# Patient Record
Sex: Female | Born: 1960 | Race: Black or African American | Hispanic: No | Smoking: Never smoker
Health system: Southern US, Community
[De-identification: ages and names within clinical notes are randomized; demographics above are authoritative.]

## PROBLEM LIST (undated history)

## (undated) DIAGNOSIS — I10 Essential (primary) hypertension: Secondary | ICD-10-CM

## (undated) DIAGNOSIS — M8580 Other specified disorders of bone density and structure, unspecified site: Secondary | ICD-10-CM

## (undated) HISTORY — DX: Other specified disorders of bone density and structure, unspecified site: M85.80

## (undated) HISTORY — PX: PLACEMENT OF BREAST IMPLANTS: SHX6334

## (undated) HISTORY — PX: AUGMENTATION MAMMAPLASTY: SUR837

## (undated) HISTORY — PX: APPENDECTOMY: SHX54

## (undated) HISTORY — DX: Essential (primary) hypertension: I10

## (undated) HISTORY — PX: BREAST IMPLANT REMOVAL: SUR1101

---

## 2001-02-03 ENCOUNTER — Other Ambulatory Visit: Admission: RE | Admit: 2001-02-03 | Discharge: 2001-02-03 | Payer: Self-pay | Admitting: Obstetrics & Gynecology

## 2002-03-04 ENCOUNTER — Other Ambulatory Visit: Admission: RE | Admit: 2002-03-04 | Discharge: 2002-03-04 | Payer: Self-pay | Admitting: Obstetrics & Gynecology

## 2003-03-14 ENCOUNTER — Other Ambulatory Visit: Admission: RE | Admit: 2003-03-14 | Discharge: 2003-03-14 | Payer: Self-pay | Admitting: Obstetrics & Gynecology

## 2004-03-18 ENCOUNTER — Other Ambulatory Visit: Admission: RE | Admit: 2004-03-18 | Discharge: 2004-03-18 | Payer: Self-pay | Admitting: Obstetrics & Gynecology

## 2005-03-19 ENCOUNTER — Other Ambulatory Visit: Admission: RE | Admit: 2005-03-19 | Discharge: 2005-03-19 | Payer: Self-pay | Admitting: Obstetrics & Gynecology

## 2007-04-27 ENCOUNTER — Emergency Department (HOSPITAL_COMMUNITY): Admission: EM | Admit: 2007-04-27 | Discharge: 2007-04-27 | Payer: Self-pay | Admitting: Emergency Medicine

## 2008-09-22 ENCOUNTER — Emergency Department (HOSPITAL_BASED_OUTPATIENT_CLINIC_OR_DEPARTMENT_OTHER): Admission: EM | Admit: 2008-09-22 | Discharge: 2008-09-22 | Payer: Self-pay | Admitting: Emergency Medicine

## 2010-10-02 LAB — URINE MICROSCOPIC-ADD ON

## 2010-10-02 LAB — URINALYSIS, ROUTINE W REFLEX MICROSCOPIC
Ketones, ur: NEGATIVE mg/dL
Leukocytes, UA: NEGATIVE
Nitrite: NEGATIVE
Protein, ur: NEGATIVE mg/dL
Urobilinogen, UA: 0.2 mg/dL (ref 0.0–1.0)

## 2011-04-01 LAB — I-STAT 8, (EC8 V) (CONVERTED LAB)
BUN: 13
Bicarbonate: 24.2 — ABNORMAL HIGH
Chloride: 105
Sodium: 139

## 2011-04-01 LAB — POCT CARDIAC MARKERS: CKMB, poc: <1 — ABNORMAL LOW

## 2012-02-04 ENCOUNTER — Other Ambulatory Visit: Payer: Self-pay | Admitting: Family Medicine

## 2012-02-04 DIAGNOSIS — R319 Hematuria, unspecified: Secondary | ICD-10-CM

## 2012-02-04 DIAGNOSIS — R351 Nocturia: Secondary | ICD-10-CM

## 2012-02-05 ENCOUNTER — Ambulatory Visit
Admission: RE | Admit: 2012-02-05 | Discharge: 2012-02-05 | Disposition: A | Discharge status: Home / Self Care | Payer: Managed Care, Other (non HMO) | Source: Ambulatory Visit | Attending: Family Medicine | Admitting: Family Medicine

## 2012-02-05 DIAGNOSIS — R319 Hematuria, unspecified: Secondary | ICD-10-CM

## 2012-02-05 DIAGNOSIS — R351 Nocturia: Secondary | ICD-10-CM

## 2014-08-08 ENCOUNTER — Other Ambulatory Visit: Payer: Self-pay | Admitting: Family Medicine

## 2014-08-08 DIAGNOSIS — R1030 Lower abdominal pain, unspecified: Secondary | ICD-10-CM

## 2014-08-09 ENCOUNTER — Ambulatory Visit
Admission: RE | Admit: 2014-08-09 | Discharge: 2014-08-09 | Disposition: A | Discharge status: Home / Self Care | Payer: BLUE CROSS/BLUE SHIELD | Source: Ambulatory Visit | Attending: Family Medicine | Admitting: Family Medicine

## 2014-08-09 ENCOUNTER — Other Ambulatory Visit: Payer: Self-pay | Admitting: Family Medicine

## 2014-08-09 ENCOUNTER — Encounter (INDEPENDENT_AMBULATORY_CARE_PROVIDER_SITE_OTHER): Payer: Self-pay

## 2014-08-09 DIAGNOSIS — R1030 Lower abdominal pain, unspecified: Secondary | ICD-10-CM

## 2014-08-10 ENCOUNTER — Ambulatory Visit
Admission: RE | Admit: 2014-08-10 | Discharge: 2014-08-10 | Disposition: A | Discharge status: Home / Self Care | Payer: BLUE CROSS/BLUE SHIELD | Source: Ambulatory Visit | Attending: Family Medicine | Admitting: Family Medicine

## 2014-08-10 DIAGNOSIS — R1030 Lower abdominal pain, unspecified: Secondary | ICD-10-CM

## 2014-08-11 ENCOUNTER — Other Ambulatory Visit: Payer: Managed Care, Other (non HMO)

## 2014-08-14 ENCOUNTER — Other Ambulatory Visit: Payer: Managed Care, Other (non HMO)

## 2015-08-28 ENCOUNTER — Other Ambulatory Visit: Payer: Self-pay | Admitting: Family Medicine

## 2015-11-22 DIAGNOSIS — M2012 Hallux valgus (acquired), left foot: Secondary | ICD-10-CM | POA: Diagnosis not present

## 2015-11-22 DIAGNOSIS — N3001 Acute cystitis with hematuria: Secondary | ICD-10-CM | POA: Diagnosis not present

## 2015-11-22 DIAGNOSIS — R350 Frequency of micturition: Secondary | ICD-10-CM | POA: Diagnosis not present

## 2015-12-18 DIAGNOSIS — H04123 Dry eye syndrome of bilateral lacrimal glands: Secondary | ICD-10-CM | POA: Diagnosis not present

## 2015-12-28 DIAGNOSIS — R350 Frequency of micturition: Secondary | ICD-10-CM | POA: Diagnosis not present

## 2015-12-28 DIAGNOSIS — R351 Nocturia: Secondary | ICD-10-CM | POA: Diagnosis not present

## 2016-01-23 DIAGNOSIS — Z1231 Encounter for screening mammogram for malignant neoplasm of breast: Secondary | ICD-10-CM | POA: Diagnosis not present

## 2016-03-18 DIAGNOSIS — H04123 Dry eye syndrome of bilateral lacrimal glands: Secondary | ICD-10-CM | POA: Diagnosis not present

## 2016-05-19 DIAGNOSIS — R351 Nocturia: Secondary | ICD-10-CM | POA: Diagnosis not present

## 2016-05-19 DIAGNOSIS — R350 Frequency of micturition: Secondary | ICD-10-CM | POA: Diagnosis not present

## 2016-05-30 DIAGNOSIS — L708 Other acne: Secondary | ICD-10-CM | POA: Diagnosis not present

## 2016-06-20 DIAGNOSIS — Z0000 Encounter for general adult medical examination without abnormal findings: Secondary | ICD-10-CM | POA: Diagnosis not present

## 2016-07-18 DIAGNOSIS — H04123 Dry eye syndrome of bilateral lacrimal glands: Secondary | ICD-10-CM | POA: Diagnosis not present

## 2016-08-07 DIAGNOSIS — F4323 Adjustment disorder with mixed anxiety and depressed mood: Secondary | ICD-10-CM | POA: Diagnosis not present

## 2016-09-04 DIAGNOSIS — Z131 Encounter for screening for diabetes mellitus: Secondary | ICD-10-CM | POA: Diagnosis not present

## 2016-09-04 DIAGNOSIS — Z0000 Encounter for general adult medical examination without abnormal findings: Secondary | ICD-10-CM | POA: Diagnosis not present

## 2016-09-04 DIAGNOSIS — Z1159 Encounter for screening for other viral diseases: Secondary | ICD-10-CM | POA: Diagnosis not present

## 2016-10-27 DIAGNOSIS — F5101 Primary insomnia: Secondary | ICD-10-CM | POA: Diagnosis not present

## 2016-10-27 DIAGNOSIS — R51 Headache: Secondary | ICD-10-CM | POA: Diagnosis not present

## 2016-10-27 DIAGNOSIS — R42 Dizziness and giddiness: Secondary | ICD-10-CM | POA: Diagnosis not present

## 2016-11-27 ENCOUNTER — Other Ambulatory Visit: Payer: Self-pay | Admitting: Family Medicine

## 2016-11-27 DIAGNOSIS — G4452 New daily persistent headache (NDPH): Secondary | ICD-10-CM

## 2016-12-14 ENCOUNTER — Encounter: Payer: Self-pay | Admitting: Radiology

## 2016-12-14 ENCOUNTER — Ambulatory Visit
Admission: RE | Admit: 2016-12-14 | Discharge: 2016-12-14 | Disposition: A | Discharge status: Home / Self Care | Payer: BLUE CROSS/BLUE SHIELD | Source: Ambulatory Visit | Attending: Family Medicine | Admitting: Family Medicine

## 2016-12-14 DIAGNOSIS — G4452 New daily persistent headache (NDPH): Secondary | ICD-10-CM

## 2016-12-14 DIAGNOSIS — R42 Dizziness and giddiness: Secondary | ICD-10-CM | POA: Diagnosis not present

## 2016-12-14 MED ORDER — GADOBENATE DIMEGLUMINE 529 MG/ML IV SOLN
13.0000 mL | Freq: Once | INTRAVENOUS | Status: AC | PRN
  Administered 2016-12-14: 13 mL via INTRAVENOUS

## 2017-02-16 DIAGNOSIS — Z1231 Encounter for screening mammogram for malignant neoplasm of breast: Secondary | ICD-10-CM | POA: Diagnosis not present

## 2017-03-24 DIAGNOSIS — Z23 Encounter for immunization: Secondary | ICD-10-CM | POA: Diagnosis not present

## 2017-03-24 DIAGNOSIS — F5101 Primary insomnia: Secondary | ICD-10-CM | POA: Diagnosis not present

## 2017-05-08 ENCOUNTER — Ambulatory Visit (INDEPENDENT_AMBULATORY_CARE_PROVIDER_SITE_OTHER): Payer: BLUE CROSS/BLUE SHIELD | Admitting: Obstetrics & Gynecology

## 2017-05-08 ENCOUNTER — Encounter: Payer: Self-pay | Admitting: Obstetrics & Gynecology

## 2017-05-08 VITALS — BP 132/84 | Ht 60.0 in | Wt 147.0 lb

## 2017-05-08 DIAGNOSIS — Z13820 Encounter for screening for osteoporosis: Secondary | ICD-10-CM

## 2017-05-08 DIAGNOSIS — Z01419 Encounter for gynecological examination (general) (routine) without abnormal findings: Secondary | ICD-10-CM

## 2017-05-08 DIAGNOSIS — Z113 Encounter for screening for infections with a predominantly sexual mode of transmission: Secondary | ICD-10-CM

## 2017-05-08 DIAGNOSIS — Z780 Asymptomatic menopausal state: Secondary | ICD-10-CM | POA: Diagnosis not present

## 2017-05-08 DIAGNOSIS — R351 Nocturia: Secondary | ICD-10-CM | POA: Diagnosis not present

## 2017-05-08 MED ORDER — SULFAMETHOXAZOLE-TRIMETHOPRIM 800-160 MG PO TABS: 1.0000 | ORAL_TABLET | Freq: Two times a day (BID) | ORAL | 0 refills | Status: AC

## 2017-05-08 NOTE — Progress Notes (Signed)
Lori MatesDora J Snyder 08-Jun-1961 [id number]   History:    56 y.o. G4P2A2 Widowed x 2010.  BT/S.  Broke up with boyfriend x about 7 months.  Abstinent currently.  Gym trainer.  RP:  Established patient presenting  for annual gyn exam   HPI:  Menopause.  No HRT.  No PMB.  Hot flushes/night sweats improving, tolerable on Estrovent.  No pelvic pain.  Breasts wnl.  Urine normal, except for frequent, but stable nocturia.  Drinks a lot of water, no caffein products.  Last glass of water at 7 pm.  BMs wnl.  Past medical history,surgical history, family history and social history were all reviewed and documented in the EPIC chart.  Gynecologic History No LMP recorded. Patient is postmenopausal. Contraception: post menopausal status Last Pap: 04/2015. Results were: normal Last mammogram: 02/2017. Results were: normal  Obstetric History OB History  No data available     ROS: A ROS was performed and pertinent positives and negatives are included in the history.  GENERAL: No fevers or chills. HEENT: No change in vision, no earache, sore throat or sinus congestion. NECK: No pain or stiffness. CARDIOVASCULAR: No chest pain or pressure. No palpitations. PULMONARY: No shortness of breath, cough or wheeze. GASTROINTESTINAL: No abdominal pain, nausea, vomiting or diarrhea, melena or bright red blood per rectum. GENITOURINARY: No urinary frequency, urgency, hesitancy or dysuria. MUSCULOSKELETAL: No joint or muscle pain, no back pain, no recent trauma. DERMATOLOGIC: No rash, no itching, no lesions. ENDOCRINE: No polyuria, polydipsia, no heat or cold intolerance. No recent change in weight. HEMATOLOGICAL: No anemia or easy bruising or bleeding. NEUROLOGIC: No headache, seizures, numbness, tingling or weakness. PSYCHIATRIC: No depression, no loss of interest in normal activity or change in sleep pattern.     Exam:   BP 132/84   Ht 5' (1.524 m)   Wt 147 lb (66.7 kg)   BMI 28.71 kg/m   Body mass index is 28.71  kg/m.  General appearance : Well developed well nourished female. No acute distress HEENT: Eyes: no retinal hemorrhage or exudates,  Neck supple, trachea midline, no carotid bruits, no thyroidmegaly Lungs: Clear to auscultation, no rhonchi or wheezes, or rib retractions  Heart: Regular rate and rhythm, no murmurs or gallops Breast:Examined in sitting and supine position were symmetrical in appearance, no palpable masses or tenderness,  no skin retraction, no nipple inversion, no nipple discharge, no skin discoloration, no axillary or supraclavicular lymphadenopathy Abdomen: no palpable masses or tenderness, no rebound or guarding Extremities: no edema or skin discoloration or tenderness  Pelvic: Vulva normal  Bartholin, Urethra, Skene Glands: Within normal limits             Vagina: No gross lesions or discharge  Cervix: No gross lesions or discharge.  Pap/HPV HR, Gono-Chlam done.  Uterus  AV, normal size, shape and consistency, non-tender and mobile  Adnexa  Without masses or tenderness  Anus and perineum  normal  U/A  Cloudy. 20-40 WBC, 0-2 RBC, moderate Bacteria.  Nit neg.  Pending U. Culture.  Assessment/Plan:  56 y.o. female for annual exam   1. Encounter for routine gynecological examination with Papanicolaou smear of cervix Normal gynecologic exam.  Pap test with HPV high risk done.  Breast exam normal.  Recent screening mammogram normal.  Colonoscopy done in 2015.  Fasting labs with family physician.  2. Menopause present Menopause well-tolerated without hormone replacement therapy.  No postmenopausal bleeding.  3. Screen for STD (sexually transmitted disease) Recommend condoms - Gono-Chlam  on Pap - HIV antibody (with reflex) - RPR - Hepatitis B Surface AntiGEN - Hepatitis C Antibody  4. Screening for osteoporosis Vitamin D supplements, calcium rich nutrition and weightbearing physical activity to continue.  Follow-up with bone density. - DG Bone Density; Future  5.  Nocturia Urinalysis compatible with cystitis.  Pending urine culture.  Decision to treat with Bactrim.  Usage discussed. - Urinalysis with Culture Reflex  Counseling on above issues >50% x 10 minutes  Genia DelMarie-Lyne Antar Milks MD, 3:11 PM 05/08/2017

## 2017-05-09 ENCOUNTER — Encounter: Payer: Self-pay | Admitting: Obstetrics & Gynecology

## 2017-05-09 NOTE — Patient Instructions (Signed)
1. Encounter for routine gynecological examination with Papanicolaou smear of cervix Normal gynecologic exam.  Pap test with HPV high risk done.  Breast exam normal.  Recent screening mammogram normal.  Colonoscopy done in 2015.  Fasting labs with family physician.  2. Menopause present Menopause well-tolerated without hormone replacement therapy.  No postmenopausal bleeding.  3. Screen for STD (sexually transmitted disease) Recommend condoms - Gono-Chlam on Pap - HIV antibody (with reflex) - RPR - Hepatitis B Surface AntiGEN - Hepatitis C Antibody  4. Screening for osteoporosis Vitamin D supplements, calcium rich nutrition and weightbearing physical activity to continue.  Follow-up with bone density. - DG Bone Density; Future  5. Nocturia Urinalysis compatible with cystitis.  Pending urine culture.  Decision to treat with Bactrim.  Usage discussed. - Urinalysis with Culture Reflex  Lori Snyder, it was a pleasure seeing you today!  I will inform you of all your results as soon as available.   Urinary Tract Infection, Adult A urinary tract infection (UTI) is an infection of any part of the urinary tract, which includes the kidneys, ureters, bladder, and urethra. These organs make, store, and get rid of urine in the body. UTI can be a bladder infection (cystitis) or kidney infection (pyelonephritis). What are the causes? This infection may be caused by fungi, viruses, or bacteria. Bacteria are the most common cause of UTIs. This condition can also be caused by repeated incomplete emptying of the bladder during urination. What increases the risk? This condition is more likely to develop if:  You ignore your need to urinate or hold urine for long periods of time.  You do not empty your bladder completely during urination.  You wipe back to front after urinating or having a bowel movement, if you are female.  You are uncircumcised, if you are female.  You are constipated.  You have a  urinary catheter that stays in place (indwelling).  You have a weak defense (immune) system.  You have a medical condition that affects your bowels, kidneys, or bladder.  You have diabetes.  You take antibiotic medicines frequently or for long periods of time, and the antibiotics no longer work well against certain types of infections (antibiotic resistance).  You take medicines that irritate your urinary tract.  You are exposed to chemicals that irritate your urinary tract.  You are female.  What are the signs or symptoms? Symptoms of this condition include:  Fever.  Frequent urination or passing small amounts of urine frequently.  Needing to urinate urgently.  Pain or burning with urination.  Urine that smells bad or unusual.  Cloudy urine.  Pain in the lower abdomen or back.  Trouble urinating.  Blood in the urine.  Vomiting or being less hungry than normal.  Diarrhea or abdominal pain.  Vaginal discharge, if you are female.  How is this diagnosed? This condition is diagnosed with a medical history and physical exam. You will also need to provide a urine sample to test your urine. Other tests may be done, including:  Blood tests.  Sexually transmitted disease (STD) testing.  If you have had more than one UTI, a cystoscopy or imaging studies may be done to determine the cause of the infections. How is this treated? Treatment for this condition often includes a combination of two or more of the following:  Antibiotic medicine.  Other medicines to treat less common causes of UTI.  Over-the-counter medicines to treat pain.  Drinking enough water to stay hydrated.  Follow these instructions  at home:  Take over-the-counter and prescription medicines only as told by your health care provider.  If you were prescribed an antibiotic, take it as told by your health care provider. Do not stop taking the antibiotic even if you start to feel better.  Avoid  alcohol, caffeine, tea, and carbonated beverages. They can irritate your bladder.  Drink enough fluid to keep your urine clear or pale yellow.  Keep all follow-up visits as told by your health care provider. This is important.  Make sure to: ? Empty your bladder often and completely. Do not hold urine for long periods of time. ? Empty your bladder before and after sex. ? Wipe from front to back after a bowel movement if you are female. Use each tissue one time when you wipe. Contact a health care provider if:  You have back pain.  You have a fever.  You feel nauseous or vomit.  Your symptoms do not get better after 3 days.  Your symptoms go away and then return. Get help right away if:  You have severe back pain or lower abdominal pain.  You are vomiting and cannot keep down any medicines or water. This information is not intended to replace advice given to you by your health care provider. Make sure you discuss any questions you have with your health care provider. Document Released: 03/19/2005 Document Revised: 11/21/2015 Document Reviewed: 04/30/2015 Elsevier Interactive Patient Education  2017 [place] Maintenance for Postmenopausal Women Menopause is a normal process in which your reproductive ability comes to an end. This process happens gradually over a span of months to years, usually between the ages of 82 and 84. Menopause is complete when you have missed 12 consecutive menstrual periods. It is important to talk with your health care provider about some of the most common conditions that affect postmenopausal women, such as heart disease, cancer, and bone loss (osteoporosis). Adopting a healthy lifestyle and getting preventive care can help to promote your health and wellness. Those actions can also lower your chances of developing some of these common conditions. What should I know about menopause? During menopause, you may experience a number of symptoms,  such as:  Moderate-to-severe hot flashes.  Night sweats.  Decrease in sex drive.  Mood swings.  Headaches.  Tiredness.  Irritability.  Memory problems.  Insomnia.  Choosing to treat or not to treat menopausal changes is an individual decision that you make with your health care provider. What should I know about hormone replacement therapy and supplements? Hormone therapy products are effective for treating symptoms that are associated with menopause, such as hot flashes and night sweats. Hormone replacement carries certain risks, especially as you become older. If you are thinking about using estrogen or estrogen with progestin treatments, discuss the benefits and risks with your health care provider. What should I know about heart disease and stroke? Heart disease, heart attack, and stroke become more likely as you age. This may be due, in part, to the hormonal changes that your body experiences during menopause. These can affect how your body processes dietary fats, triglycerides, and cholesterol. Heart attack and stroke are both medical emergencies. There are many things that you can do to help prevent heart disease and stroke:  Have your blood pressure checked at least every 1-2 years. High blood pressure causes heart disease and increases the risk of stroke.  If you are 31-1 years old, ask your health care provider if you should take aspirin to  prevent a heart attack or a stroke.  Do not use any tobacco products, including cigarettes, chewing tobacco, or electronic cigarettes. If you need help quitting, ask your health care provider.  It is important to eat a healthy diet and maintain a healthy weight. ? Be sure to include plenty of vegetables, fruits, low-fat dairy products, and lean protein. ? Avoid eating foods that are high in solid fats, added sugars, or salt (sodium).  Get regular exercise. This is one of the most important things that you can do for your  health. ? Try to exercise for at least 150 minutes each week. The type of exercise that you do should increase your heart rate and make you sweat. This is known as moderate-intensity exercise. ? Try to do strengthening exercises at least twice each week. Do these in addition to the moderate-intensity exercise.  Know your numbers.Ask your health care provider to check your cholesterol and your blood glucose. Continue to have your blood tested as directed by your health care provider.  What should I know about cancer screening? There are several types of cancer. Take the following steps to reduce your risk and to catch any cancer development as early as possible. Breast Cancer  Practice breast self-awareness. ? This means understanding how your breasts normally appear and feel. ? It also means doing regular breast self-exams. Let your health care provider know about any changes, no matter how small.  If you are 36 or older, have a clinician do a breast exam (clinical breast exam or CBE) every year. Depending on your age, family history, and medical history, it may be recommended that you also have a yearly breast X-ray (mammogram).  If you have a family history of breast cancer, talk with your health care provider about genetic screening.  If you are at high risk for breast cancer, talk with your health care provider about having an MRI and a mammogram every year.  Breast cancer (BRCA) gene test is recommended for women who have family members with BRCA-related cancers. Results of the assessment will determine the need for genetic counseling and BRCA1 and for BRCA2 testing. BRCA-related cancers include these types: ? Breast. This occurs in males or females. ? Ovarian. ? Tubal. This may also be called fallopian tube cancer. ? Cancer of the abdominal or pelvic lining (peritoneal cancer). ? Prostate. ? Pancreatic.  Cervical, Uterine, and Ovarian Cancer Your health care provider may recommend  that you be screened regularly for cancer of the pelvic organs. These include your ovaries, uterus, and vagina. This screening involves a pelvic exam, which includes checking for microscopic changes to the surface of your cervix (Pap test).  For women ages 21-65, health care providers may recommend a pelvic exam and a Pap test every three years. For women ages 69-65, they may recommend the Pap test and pelvic exam, combined with testing for human papilloma virus (HPV), every five years. Some types of HPV increase your risk of cervical cancer. Testing for HPV may also be done on women of any age who have unclear Pap test results.  Other health care providers may not recommend any screening for nonpregnant women who are considered low risk for pelvic cancer and have no symptoms. Ask your health care provider if a screening pelvic exam is right for you.  If you have had past treatment for cervical cancer or a condition that could lead to cancer, you need Pap tests and screening for cancer for at least 20 years after  your treatment. If Pap tests have been discontinued for you, your risk factors (such as having a new sexual partner) need to be reassessed to determine if you should start having screenings again. Some women have medical problems that increase the chance of getting cervical cancer. In these cases, your health care provider may recommend that you have screening and Pap tests more often.  If you have a family history of uterine cancer or ovarian cancer, talk with your health care provider about genetic screening.  If you have vaginal bleeding after reaching menopause, tell your health care provider.  There are currently no reliable tests available to screen for ovarian cancer.  Lung Cancer Lung cancer screening is recommended for adults 44-45 years old who are at high risk for lung cancer because of a history of smoking. A yearly low-dose CT scan of the lungs is recommended if you:  Currently  smoke.  Have a history of at least 30 pack-years of smoking and you currently smoke or have quit within the past 15 years. A pack-year is smoking an average of one pack of cigarettes per day for one year.  Yearly screening should:  Continue until it has been 15 years since you quit.  Stop if you develop a health problem that would prevent you from having lung cancer treatment.  Colorectal Cancer  This type of cancer can be detected and can often be prevented.  Routine colorectal cancer screening usually begins at age 37 and continues through age 67.  If you have risk factors for colon cancer, your health care provider may recommend that you be screened at an earlier age.  If you have a family history of colorectal cancer, talk with your health care provider about genetic screening.  Your health care provider may also recommend using home test kits to check for hidden blood in your stool.  A small camera at the end of a tube can be used to examine your colon directly (sigmoidoscopy or colonoscopy). This is done to check for the earliest forms of colorectal cancer.  Direct examination of the colon should be repeated every 5-10 years until age 15. However, if early forms of precancerous polyps or small growths are found or if you have a family history or genetic risk for colorectal cancer, you may need to be screened more often.  Skin Cancer  Check your skin from head to toe regularly.  Monitor any moles. Be sure to tell your health care provider: ? About any new moles or changes in moles, especially if there is a change in a mole's shape or color. ? If you have a mole that is larger than the size of a pencil eraser.  If any of your family members has a history of skin cancer, especially at a young age, talk with your health care provider about genetic screening.  Always use sunscreen. Apply sunscreen liberally and repeatedly throughout the day.  Whenever you are outside, protect  yourself by wearing long sleeves, pants, a wide-brimmed hat, and sunglasses.  What should I know about osteoporosis? Osteoporosis is a condition in which bone destruction happens more quickly than new bone creation. After menopause, you may be at an increased risk for osteoporosis. To help prevent osteoporosis or the bone fractures that can happen because of osteoporosis, the following is recommended:  If you are 22-61 years old, get at least 1,000 mg of calcium and at least 600 mg of vitamin D per day.  If you are older than  age 41 but younger than age 24, get at least 1,200 mg of calcium and at least 600 mg of vitamin D per day.  If you are older than age 50, get at least 1,200 mg of calcium and at least 800 mg of vitamin D per day.  Smoking and excessive alcohol intake increase the risk of osteoporosis. Eat foods that are rich in calcium and vitamin D, and do weight-bearing exercises several times each week as directed by your health care provider. What should I know about how menopause affects my mental health? Depression may occur at any age, but it is more common as you become older. Common symptoms of depression include:  Low or sad mood.  Changes in sleep patterns.  Changes in appetite or eating patterns.  Feeling an overall lack of motivation or enjoyment of activities that you previously enjoyed.  Frequent crying spells.  Talk with your health care provider if you think that you are experiencing depression. What should I know about immunizations? It is important that you get and maintain your immunizations. These include:  Tetanus, diphtheria, and pertussis (Tdap) booster vaccine.  Influenza every year before the flu season begins.  Pneumonia vaccine.  Shingles vaccine.  Your health care provider may also recommend other immunizations. This information is not intended to replace advice given to you by your health care provider. Make sure you discuss any questions you  have with your health care provider. Document Released: 08/01/2005 Document Revised: 12/28/2015 Document Reviewed: 03/13/2015 Elsevier Interactive Patient Education  2018 [employer].

## 2017-05-10 LAB — URINALYSIS W MICROSCOPIC + REFLEX CULTURE
Bilirubin Urine: NEGATIVE
GLUCOSE, UA: NEGATIVE
Granular Cast: NONE SEEN /LPF
Hyaline Cast: NONE SEEN /LPF
Ketones, ur: NEGATIVE
NITRITES URINE, INITIAL: NEGATIVE
PH: 5 (ref 5.0–8.0)
Protein, ur: NEGATIVE
SPECIFIC GRAVITY, URINE: 1.02 (ref 1.001–1.03)

## 2017-05-10 LAB — URINE CULTURE
MICRO NUMBER:: 81296827
SPECIMEN QUALITY: ADEQUATE

## 2017-05-10 LAB — CULTURE INDICATED

## 2017-05-11 LAB — HEPATITIS B SURFACE ANTIGEN: HEP B S AG: NONREACTIVE

## 2017-05-11 LAB — HEPATITIS C ANTIBODY
Hepatitis C Ab: NONREACTIVE
SIGNAL TO CUT-OFF: 0.02 (ref <1.00)

## 2017-05-11 LAB — HIV ANTIBODY (ROUTINE TESTING W REFLEX): HIV: NONREACTIVE

## 2017-05-11 LAB — RPR: RPR: NONREACTIVE

## 2017-05-13 ENCOUNTER — Other Ambulatory Visit: Payer: Self-pay | Admitting: Gynecology

## 2017-05-13 ENCOUNTER — Telehealth: Payer: Self-pay | Admitting: *Deleted

## 2017-05-13 DIAGNOSIS — Z13820 Encounter for screening for osteoporosis: Secondary | ICD-10-CM

## 2017-05-13 NOTE — Telephone Encounter (Signed)
Pt informed

## 2017-05-13 NOTE — Telephone Encounter (Signed)
Patient completed setpra ds x 3 days, states still having frequent urination several times. Please advise

## 2017-05-13 NOTE — Telephone Encounter (Signed)
Urine culture was negative.  Recommend increase water intake, avoid caffeine products and observation.

## 2017-05-22 LAB — PAP IG, CT-NG NAA, HPV HIGH-RISK
C. trachomatis RNA, TMA: NOT DETECTED
HPV DNA High Risk: DETECTED — AB
N. GONORRHOEAE RNA, TMA: NOT DETECTED

## 2017-05-22 LAB — HPV TYPE 16 AND 18/45 RNA
HPV Type 16 RNA: NOT DETECTED
HPV Type 18/45 RNA: NOT DETECTED

## 2017-05-23 DIAGNOSIS — M8580 Other specified disorders of bone density and structure, unspecified site: Secondary | ICD-10-CM

## 2017-05-23 HISTORY — DX: Other specified disorders of bone density and structure, unspecified site: M85.80

## 2017-05-29 DIAGNOSIS — H04123 Dry eye syndrome of bilateral lacrimal glands: Secondary | ICD-10-CM | POA: Diagnosis not present

## 2017-06-04 ENCOUNTER — Encounter: Payer: Self-pay | Admitting: Gynecology

## 2017-06-04 ENCOUNTER — Ambulatory Visit (INDEPENDENT_AMBULATORY_CARE_PROVIDER_SITE_OTHER): Payer: BLUE CROSS/BLUE SHIELD

## 2017-06-04 ENCOUNTER — Other Ambulatory Visit: Payer: Self-pay | Admitting: Gynecology

## 2017-06-04 DIAGNOSIS — Z13820 Encounter for screening for osteoporosis: Secondary | ICD-10-CM

## 2017-06-04 DIAGNOSIS — M8588 Other specified disorders of bone density and structure, other site: Secondary | ICD-10-CM | POA: Diagnosis not present

## 2017-06-26 DIAGNOSIS — S40029A Contusion of unspecified upper arm, initial encounter: Secondary | ICD-10-CM | POA: Diagnosis not present

## 2017-07-10 ENCOUNTER — Encounter: Payer: Self-pay | Admitting: Obstetrics & Gynecology

## 2017-07-10 ENCOUNTER — Ambulatory Visit (INDEPENDENT_AMBULATORY_CARE_PROVIDER_SITE_OTHER): Payer: BLUE CROSS/BLUE SHIELD | Admitting: Obstetrics & Gynecology

## 2017-07-10 VITALS — BP 128/86

## 2017-07-10 DIAGNOSIS — B977 Papillomavirus as the cause of diseases classified elsewhere: Secondary | ICD-10-CM

## 2017-07-10 DIAGNOSIS — N72 Inflammatory disease of cervix uteri: Secondary | ICD-10-CM | POA: Diagnosis not present

## 2017-07-10 DIAGNOSIS — N870 Mild cervical dysplasia: Secondary | ICD-10-CM | POA: Diagnosis not present

## 2017-07-10 DIAGNOSIS — Z13220 Encounter for screening for lipoid disorders: Secondary | ICD-10-CM | POA: Diagnosis not present

## 2017-07-10 NOTE — Addendum Note (Signed)
Addended by: Berna SpareASTILLO, BLANCA A on: 07/10/2017 12:10 PM   Modules accepted: Orders

## 2017-07-10 NOTE — Patient Instructions (Signed)
1. High risk human papilloma virus (HPV) infection of cervix Colposcopy done today.  Probably normal, possible mild dysplasia.  Cervical biopsy pending.  HPV 16, 18 and 45 negative.  Management per cervical biopsy results.   Colposcopy, Care After This sheet gives you information about how to care for yourself after your procedure. Your health care provider may also give you more specific instructions. If you have problems or questions, contact your health care provider. What can I expect after the procedure? If you had a colposcopy without a biopsy, you can expect to feel fine right away, but you may have some spotting for a few days. You can go back to your normal activities. If you had a colposcopy with a biopsy, it is common to have:  Soreness and pain. This may last for a few days.  Light-headedness.  Mild vaginal bleeding or dark-colored, grainy discharge. This may last for a few days. The discharge may be due to a solution that was used during the procedure. You may need to wear a sanitary pad during this time.  Spotting for at least 48 hours after the procedure.  Follow these instructions at home:  Take over-the-counter and prescription medicines only as told by your health care provider. Talk with your health care provider about what type of over-the-counter pain medicine and prescription medicine you can start taking again. It is especially important to talk with your health care provider if you take blood-thinning medicine.  Do not drive or use heavy machinery while taking prescription pain medicine.  For at least 3 days after your procedure, or as long as told by your health care provider, avoid: ? Douching. ? Using tampons. ? Having sexual intercourse.  Continue to use birth control (contraception).  Limit your physical activity for the first day after the procedure as told by your health care provider. Ask your health care provider what activities are safe for you.  It  is up to you to get the results of your procedure. Ask your health care provider, or the department performing the procedure, when your results will be ready.  Keep all follow-up visits as told by your health care provider. This is important. Contact a health care provider if:  You develop a skin rash. Get help right away if:  You are bleeding heavily from your vagina or you are passing blood clots. This includes using more than one sanitary pad per hour for 2 hours in a row.  You have a fever or chills.  You have pelvic pain.  You have abnormal, yellow-colored, or bad-smelling vaginal discharge. This could be a sign of infection.  You have severe pain or cramps in your lower abdomen that do not get better with medicine.  You feel light-headed or dizzy, or you faint. Summary  If you had a colposcopy without a biopsy, you can expect to feel fine right away, but you may have some spotting for a few days. You can go back to your normal activities.  If you had a colposcopy with a biopsy, you may notice mild pain and spotting for 48 hours after the procedure.  Avoid douching, using tampons, and having sexual intercourse for 3 days after the procedure or as long as told by your health care provider.  Contact your health care provider if you have bleeding, severe pain, or signs of infection. This information is not intended to replace advice given to you by your health care provider. Make sure you discuss any questions you  have with your health care provider. Document Released: 03/30/2013 Document Revised: 01/25/2016 Document Reviewed: 01/25/2016 Elsevier Interactive Patient Education  2018 [employer].

## 2017-07-10 NOTE — Progress Notes (Signed)
    Lori MatesDora J Snyder May 18, 1961 [id number]        57 y.o.  [id number]   RP:  Pap negative, but HR HPV positive  HPI:  HPV 16-18-45 neg.  Full STI screen neg otherwise on 05/08/2017.  Not sexually active since then.  No pelvic pain, no vaginal d/c.  Past medical history,surgical history, problem list, medications, allergies, family history and social history were all reviewed and documented in the EPIC chart.  Directed ROS with pertinent positives and negatives documented in the history of present illness/assessment and plan.  Exam:  Vitals:   07/10/17 1103  BP: 128/86   General appearance:  Normal  Colposcopy Procedure Note Lori Snyder 07/10/2017  Indications:  Procedure Details  The risks and benefits of the procedure and Verbal informed consent obtained.  Speculum placed in vagina and excellent visualization of cervix achieved, cervix swabbed x 3 with acetic acid solution.  Findings:  Cervix colposcopy: Physical Exam  Genitourinary:      Vaginal colposcopy: Normal  Vulvar colposcopy:  Grossly normal  Perirectal colposcopy: Grossly normal  Specimens: Cervical Bx at 10 O'clock  Complications: None, Silver Nitrate for hemostasis.  Plan:  Pending Bx results, management per results.   Assessment/Plan:  [age] y.o. [id number]   1. High risk human papilloma virus (HPV) infection of cervix Colposcopy done today.  Probably normal, possible mild dysplasia.  Cervical biopsy pending.  HPV 16, 18 and 45 negative.  Management per cervical biopsy results.  Genia DelMarie-Lyne Madden Garron MD, 11:31 AM 07/10/2017

## 2017-07-13 DIAGNOSIS — N3281 Overactive bladder: Secondary | ICD-10-CM | POA: Diagnosis not present

## 2017-07-13 DIAGNOSIS — R350 Frequency of micturition: Secondary | ICD-10-CM | POA: Diagnosis not present

## 2017-07-13 DIAGNOSIS — N3001 Acute cystitis with hematuria: Secondary | ICD-10-CM | POA: Diagnosis not present

## 2017-07-14 LAB — TISSUE SPECIMEN

## 2017-07-14 LAB — PATHOLOGY

## 2017-09-09 DIAGNOSIS — Z0000 Encounter for general adult medical examination without abnormal findings: Secondary | ICD-10-CM | POA: Diagnosis not present

## 2017-09-09 DIAGNOSIS — Z86010 Personal history of colonic polyps: Secondary | ICD-10-CM | POA: Diagnosis not present

## 2017-09-09 DIAGNOSIS — N3281 Overactive bladder: Secondary | ICD-10-CM | POA: Diagnosis not present

## 2017-09-30 DIAGNOSIS — T8544XD Capsular contracture of breast implant, subsequent encounter: Secondary | ICD-10-CM | POA: Diagnosis not present

## 2017-10-23 DIAGNOSIS — D2339 Other benign neoplasm of skin of other parts of face: Secondary | ICD-10-CM | POA: Diagnosis not present

## 2017-10-23 DIAGNOSIS — L218 Other seborrheic dermatitis: Secondary | ICD-10-CM | POA: Diagnosis not present

## 2017-10-23 DIAGNOSIS — L821 Other seborrheic keratosis: Secondary | ICD-10-CM | POA: Diagnosis not present

## 2018-01-29 ENCOUNTER — Ambulatory Visit: Payer: BLUE CROSS/BLUE SHIELD | Admitting: Obstetrics & Gynecology

## 2018-02-11 DIAGNOSIS — G479 Sleep disorder, unspecified: Secondary | ICD-10-CM | POA: Diagnosis not present

## 2018-02-12 ENCOUNTER — Ambulatory Visit: Payer: BLUE CROSS/BLUE SHIELD | Admitting: Obstetrics & Gynecology

## 2018-02-12 ENCOUNTER — Encounter: Payer: Self-pay | Admitting: Obstetrics & Gynecology

## 2018-02-12 VITALS — BP 130/82

## 2018-02-12 DIAGNOSIS — B977 Papillomavirus as the cause of diseases classified elsewhere: Secondary | ICD-10-CM | POA: Diagnosis not present

## 2018-02-12 DIAGNOSIS — N870 Mild cervical dysplasia: Secondary | ICD-10-CM

## 2018-02-12 DIAGNOSIS — N72 Inflammatory disease of cervix uteri: Secondary | ICD-10-CM | POA: Diagnosis not present

## 2018-02-12 NOTE — Addendum Note (Signed)
Addended by: Berna SpareASTILLO, Destina Mantei A on: 02/12/2018 10:51 AM   Modules accepted: Orders

## 2018-02-12 NOTE — Patient Instructions (Signed)
1. Dysplasia of cervix, low grade (CIN 1) CIN-1 on colposcopy January 2019.  Repeat Pap test with high risk HPV testing today.  2. High risk human papilloma virus (HPV) infection of cervix HPV 16-18-45 negative.  Follow-up annual gynecologic exam in 4 to 6 months.  Lori Snyder, it was a pleasure seeing you today!  I will inform you of your results as soon as they are available.

## 2018-02-12 NOTE — Progress Notes (Signed)
    Lori MatesDora J Snyder 1960-08-23 [id number]        [age] y.o.  G3P2A1L2  Sons doing well.  [place] in [place], [employer] business.  Oldest works for [employer] [place] [employer].  RP: Repeat Pap for CIN 1 at colpo 06/2017  HPI: Pap neg/HPV HR pos.  HPV 16-18-45 neg.  Colpo 06/2017 CIN 1.   OB History  Gravida Para Term Preterm AB Living  3 2     1 2   SAB TAB Ectopic Multiple Live Births    1          # Outcome Date GA Lbr Len/2nd Weight Sex Delivery Anes PTL Lv  3 TAB           2 Para           1 Para             Past medical history,surgical history, problem list, medications, allergies, family history and social history were all reviewed and documented in the EPIC chart.   Directed ROS with pertinent positives and negatives documented in the history of present illness/assessment and plan.  Exam:  Vitals:   02/12/18 1023  BP: 130/82   General appearance:  Normal   Gynecologic exam: Vulva normal.  Speculum: Cervix and vagina normal.  Pap test with high risk HPV done.   Assessment/Plan:  57 y.o. [id number]   1. Dysplasia of cervix, low grade (CIN 1) CIN-1 on colposcopy January 2019.  Repeat Pap test with high risk HPV testing today.  2. High risk human papilloma virus (HPV) infection of cervix HPV 16-18-45 negative.  Follow-up annual gynecologic exam in 4 to 6 months.  Counseling on above issues and coordination of care more than 50% for 15 minutes.  Lori DelMarie-Lyne Mayla Biddy MD, 10:37 AM 02/12/2018

## 2018-02-16 LAB — PAP, TP IMAGING W/ HPV RNA, RFLX HPV TYPE 16,18/45: HPV DNA High Risk: NOT DETECTED

## 2018-03-05 DIAGNOSIS — Z1231 Encounter for screening mammogram for malignant neoplasm of breast: Secondary | ICD-10-CM | POA: Diagnosis not present

## 2018-03-26 DIAGNOSIS — L720 Epidermal cyst: Secondary | ICD-10-CM | POA: Diagnosis not present

## 2018-04-26 DIAGNOSIS — G4721 Circadian rhythm sleep disorder, delayed sleep phase type: Secondary | ICD-10-CM | POA: Diagnosis not present

## 2018-06-02 DIAGNOSIS — Z0189 Encounter for other specified special examinations: Secondary | ICD-10-CM | POA: Diagnosis not present

## 2018-06-02 DIAGNOSIS — G479 Sleep disorder, unspecified: Secondary | ICD-10-CM | POA: Diagnosis not present

## 2018-06-02 DIAGNOSIS — Z23 Encounter for immunization: Secondary | ICD-10-CM | POA: Diagnosis not present

## 2018-07-12 ENCOUNTER — Ambulatory Visit: Payer: BLUE CROSS/BLUE SHIELD | Admitting: Obstetrics & Gynecology

## 2018-07-12 DIAGNOSIS — Z0289 Encounter for other administrative examinations: Secondary | ICD-10-CM

## 2018-09-22 ENCOUNTER — Other Ambulatory Visit: Payer: Self-pay

## 2018-09-24 ENCOUNTER — Ambulatory Visit (INDEPENDENT_AMBULATORY_CARE_PROVIDER_SITE_OTHER): Payer: BLUE CROSS/BLUE SHIELD | Admitting: Obstetrics & Gynecology

## 2018-09-24 ENCOUNTER — Other Ambulatory Visit: Payer: Self-pay

## 2018-09-24 ENCOUNTER — Encounter: Payer: Self-pay | Admitting: Obstetrics & Gynecology

## 2018-09-24 VITALS — BP 140/88 | Ht 59.0 in | Wt 144.0 lb

## 2018-09-24 DIAGNOSIS — N870 Mild cervical dysplasia: Secondary | ICD-10-CM

## 2018-09-24 DIAGNOSIS — M8588 Other specified disorders of bone density and structure, other site: Secondary | ICD-10-CM | POA: Diagnosis not present

## 2018-09-24 DIAGNOSIS — Z780 Asymptomatic menopausal state: Secondary | ICD-10-CM | POA: Diagnosis not present

## 2018-09-24 DIAGNOSIS — Z1151 Encounter for screening for human papillomavirus (HPV): Secondary | ICD-10-CM

## 2018-09-24 DIAGNOSIS — R87610 Atypical squamous cells of undetermined significance on cytologic smear of cervix (ASC-US): Secondary | ICD-10-CM | POA: Diagnosis not present

## 2018-09-24 DIAGNOSIS — Z01419 Encounter for gynecological examination (general) (routine) without abnormal findings: Secondary | ICD-10-CM

## 2018-09-24 NOTE — Addendum Note (Signed)
Addended by: Berna Spare A on: 09/24/2018 09:40 AM   Modules accepted: Orders

## 2018-09-24 NOTE — Patient Instructions (Signed)
  1. Encounter for routine gynecological examination with Papanicolaou smear of cervix Normal gynecologic exam.  Pap test with high-risk HPV done today.  History of CIN-1 with negative HPV 16-18-45.  Last Pap test August 2019 was negative with negative high-risk HPV.  Breast exam status post bilateral implants with right implant incarcerated and tender.  Patient will be reevaluated by her plastic surgeon.  Schedule screening mammogram.  Will obtain the screening mammogram from [facility] done in 2019.  Health labs with family physician.  Colonoscopy 2016.  2. Postmenopausal Well on no hormone replacement therapy.  No postmenopausal bleeding.  3. Osteopenia of lumbar spine Last bone density December 2018 showed osteopenia at the spine only with a T score of -1.1.  Patient prefers to repeat a bone density at 2 years.  Vitamin D supplements, calcium intake of 1.5 g/day total and regular weightbearing physical activity is recommended. - DG Bone Density; Future  4. Dysplasia of cervix, low grade (CIN 1) Pap test with high-risk HPV done today.    Other orders - cholecalciferol (VITAMIN D3) 25 MCG (1000 UT) tablet; Take 1,000 Units by mouth daily. - magnesium 30 MG tablet; Take 30 mg by mouth 2 (two) times daily. - Calcium 280 MG TABS; Take by mouth.  Lori Snyder, it was a pleasure seeing you today!  I will inform you of your results as soon as they are available.

## 2018-09-24 NOTE — Progress Notes (Signed)
Lori Snyder 1961-04-29 [id number]   History:    58 y.o. W7P7T0G2  Widowed  RP:  Established patient presenting for annual gyn exam   HPI: Postmenopause, well on no HRT.  No PMB.  No pelvic pain.  Not currently sexually active.  Urine and bowel movements normal.  Status post bilateral breast implants.  Right implant is incarcerated and painful.  Body mass index 29.08 with a very good muscle mass.  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  Past medical history,surgical history, family history and social history were all reviewed and documented in the EPIC chart.  Gynecologic History No LMP recorded. Patient is postmenopausal. Contraception: post menopausal status Last Pap: 01/2018. Results were: Negative/HPV HR neg Last mammogram: 2019. Results were: Normal per patient, will obtain from [facility] Bone Density: 05/2017 Osteopenia at Spine Colonoscopy: 2016  Obstetric History OB History  Gravida Para Term Preterm AB Living  3 2     1 2   SAB TAB Ectopic Multiple Live Births    1          # Outcome Date GA Lbr Len/2nd Weight Sex Delivery Anes PTL Lv  3 TAB           2 Para           1 Para              ROS: A ROS was performed and pertinent positives and negatives are included in the history.  GENERAL: No fevers or chills. HEENT: No change in vision, no earache, sore throat or sinus congestion. NECK: No pain or stiffness. CARDIOVASCULAR: No chest pain or pressure. No palpitations. PULMONARY: No shortness of breath, cough or wheeze. GASTROINTESTINAL: No abdominal pain, nausea, vomiting or diarrhea, melena or bright red blood per rectum. GENITOURINARY: No urinary frequency, urgency, hesitancy or dysuria. MUSCULOSKELETAL: No joint or muscle pain, no back pain, no recent trauma. DERMATOLOGIC: No rash, no itching, no lesions. ENDOCRINE: No polyuria, polydipsia, no heat or cold intolerance. No recent change in weight. HEMATOLOGICAL: No anemia or easy bruising or bleeding.  NEUROLOGIC: No headache, seizures, numbness, tingling or weakness. PSYCHIATRIC: No depression, no loss of interest in normal activity or change in sleep pattern.     Exam:   BP 140/88   Ht 4\' 11"  (1.499 m)   Wt 144 lb (65.3 kg)   BMI 29.08 kg/m   Body mass index is 29.08 kg/m.  General appearance : Well developed well nourished female. No acute distress HEENT: Eyes: no retinal hemorrhage or exudates,  Neck supple, trachea midline, no carotid bruits, no thyroidmegaly Lungs: Clear to auscultation, no rhonchi or wheezes, or rib retractions  Heart: Regular rate and rhythm, no murmurs or gallops Breast:Examined in sitting and supine position were symmetrical in appearance, no palpable masses or tenderness,  no skin retraction, no nipple inversion, no nipple discharge, no skin discoloration, no axillary or supraclavicular lymphadenopathy Abdomen: no palpable masses or tenderness, no rebound or guarding Extremities: no edema or skin discoloration or tenderness  Pelvic: Vulva: Normal             Vagina: No gross lesions or discharge  Cervix: No gross lesions or discharge.  Pap/HPV HR done.  Uterus  AV, normal size, shape and consistency, non-tender and mobile  Adnexa  Without masses or tenderness  Anus: Normal   Assessment/Plan:  58 y.o. female for annual exam   1. Encounter for routine gynecological examination with Papanicolaou smear of cervix Normal  gynecologic exam.  Pap test with high-risk HPV done today.  History of CIN-1 with negative HPV 16-18-45.  Last Pap test August 2019 was negative with negative high-risk HPV.  Breast exam status post bilateral implants with right implant incarcerated and tender.  Patient will be reevaluated by her plastic surgeon.  Schedule screening mammogram.  Will obtain the screening mammogram from [facility] done in 2019.  Health labs with family physician.  Colonoscopy 2016.  2. Postmenopausal Well on no hormone replacement therapy.  No postmenopausal  bleeding.  3. Osteopenia of lumbar spine Last bone density December 2018 showed osteopenia at the spine only with a T score of -1.1.  Patient prefers to repeat a bone density at 2 years.  Vitamin D supplements, calcium intake of 1.5 g/day total and regular weightbearing physical activity is recommended. - DG Bone Density; Future  4. Dysplasia of cervix, low grade (CIN 1) Pap test with high-risk HPV done today.    Other orders - cholecalciferol (VITAMIN D3) 25 MCG (1000 UT) tablet; Take 1,000 Units by mouth daily. - magnesium 30 MG tablet; Take 30 mg by mouth 2 (two) times daily. - Calcium 280 MG TABS; Take by mouth.  [name], 8:38 AM 09/24/2018

## 2018-09-28 LAB — PAP, TP IMAGING W/ HPV RNA, RFLX HPV TYPE 16,18/45: HPV DNA High Risk: NOT DETECTED

## 2018-12-08 IMAGING — MR MR HEAD WO/W CM
12 series · 48 of 48 positions shown · IV contrast (multihance)
Comparison: None.

CLINICAL DATA: Dizziness and headaches for 2 months.

EXAM:
MRI HEAD WITHOUT AND WITH CONTRAST
TECHNIQUE: Multiplanar, multiecho pulse sequences of the brain and surrounding
structures were obtained without and with intravenous contrast.
CONTRAST:  13mL MULTIHANCE GADOBENATE DIMEGLUMINE 529 MG/ML IV SOLN

[Series 5: T1 · sagittal · 4.0mm · 0.75mm/px · 2 of 28 slices shown (1 of 3)]
[im 1/28]
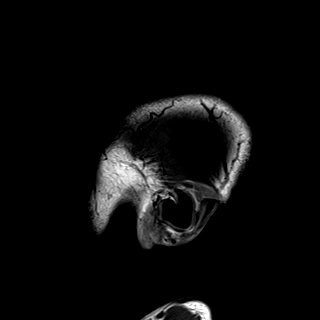
[im 28/28]
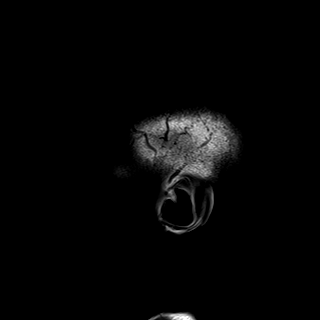

[Series 6: DWI · axial · 3.0mm · 1.44mm/px · z∈[-79,+54]mm · 5 of 84 slices shown (1 of 4)]
[im 1/84]
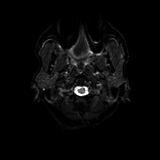
[im 21/84]
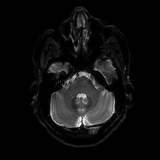
[im 42/84]
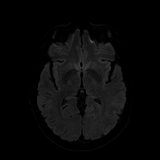
[im 63/84]
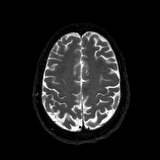
[im 84/84]
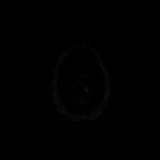

[Series 7: DWI · axial · 3.0mm · 1.44mm/px · z∈[-79,+54]mm · 3 of 42 slices shown (2 of 4)]
[im 1/42]
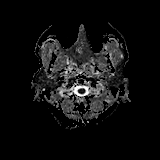
[im 21/42]
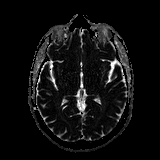
[im 42/42]
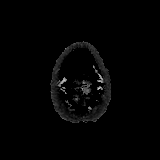

[Series 8: DWI · coronal · 5.0mm · 1.44mm/px · 4 of 60 slices shown (3 of 4)]
[im 1/60]
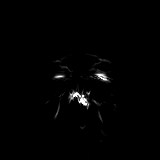
[im 20/60]
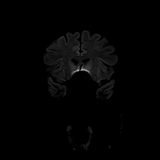
[im 40/60]
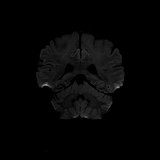
[im 60/60]
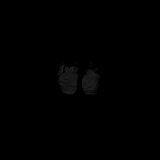

[Series 9: DWI · coronal · 5.0mm · 1.44mm/px · 2 of 30 slices shown (4 of 4)]
[im 1/30]
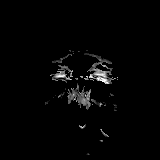
[im 30/30]
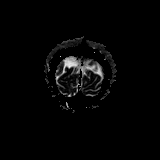

[Series 10: T2 · axial · 4.0mm · 0.36mm/px · z∈[-81,+50]mm · 2 of 27 slices shown]
[im 1/27]
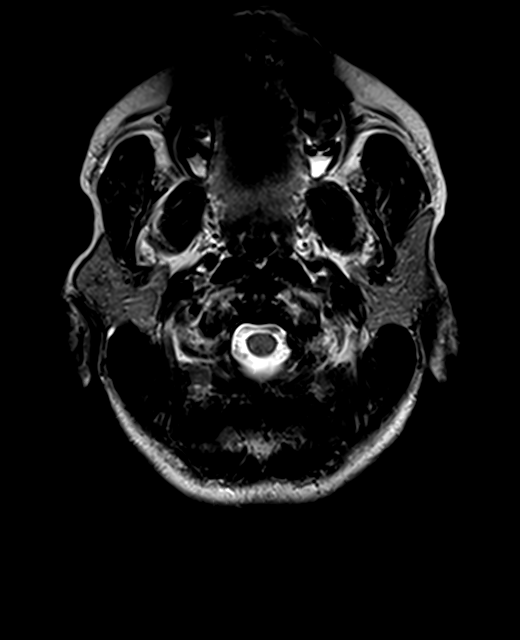
[im 27/27]
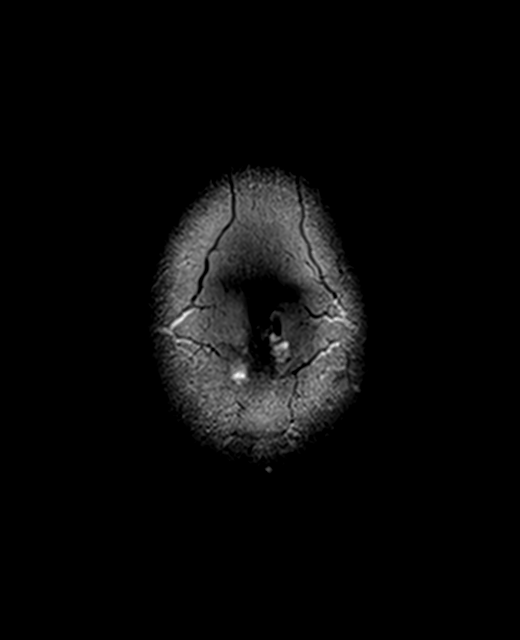

[Series 11: FLAIR · axial · 3.0mm · 0.72mm/px · z∈[-89,+57]mm · 2 of 26 slices shown]
[im 1/26]
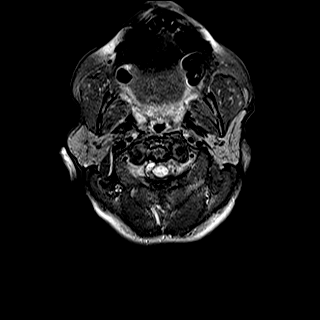
[im 26/26]
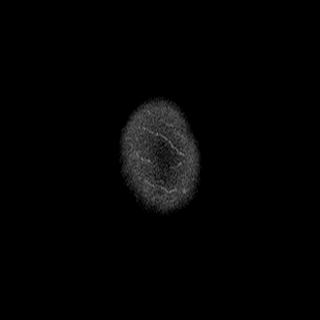

[Series 13: swi_images · axial · 1.5mm · 0.90mm/px · z∈[-85,+54]mm · 6 of 96 slices shown]
[im 1/96]
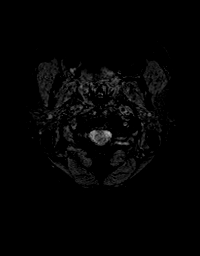
[im 20/96]
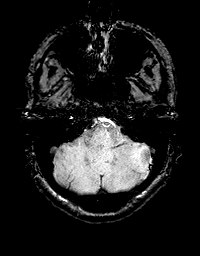
[im 39/96]
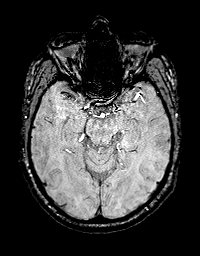
[im 58/96]
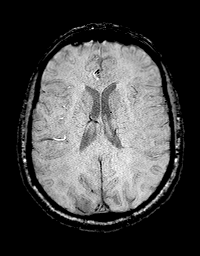
[im 77/96]
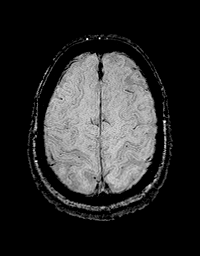
[im 96/96]
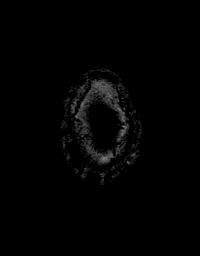

[Series 14: T1 · axial · 1.0mm · 0.90mm/px · z∈[-85,+54]mm · 9 of 144 slices shown (2 of 3)]
[im 1/144]
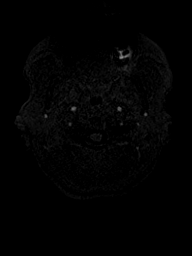
[im 18/144]
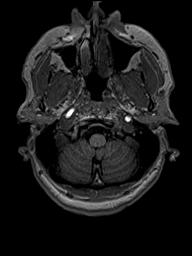
[im 36/144]
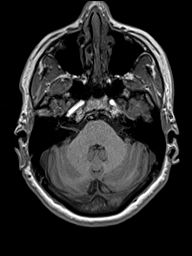
[im 54/144]
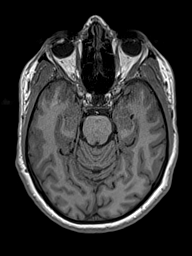
[im 72/144]
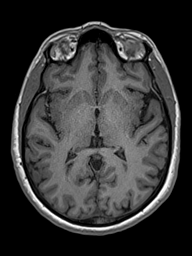
[im 90/144]
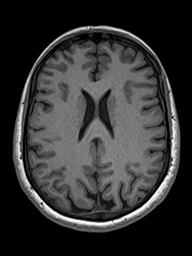
[im 108/144]
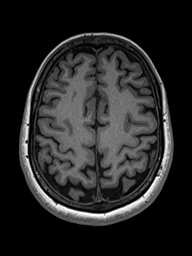
[im 126/144]
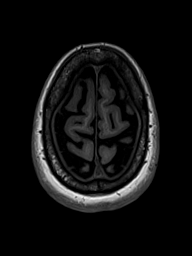
[im 144/144]
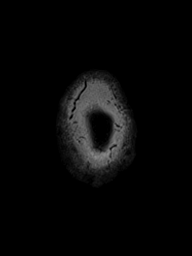

[Series 15: T2 post-contrast · coronal · 4.0mm · 0.36mm/px · 2 of 34 slices shown]
[im 1/34]
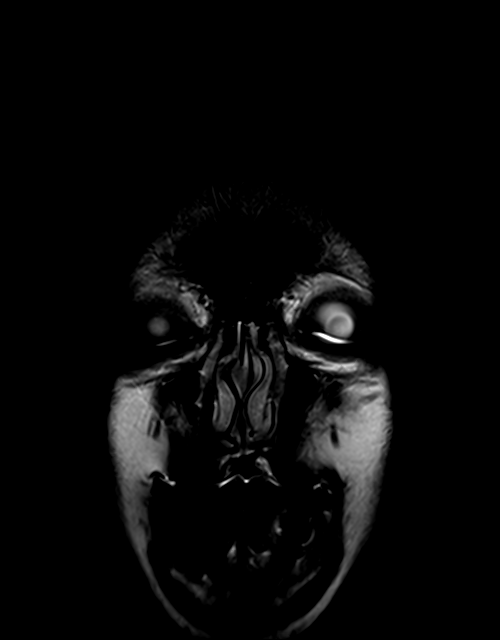
[im 34/34]
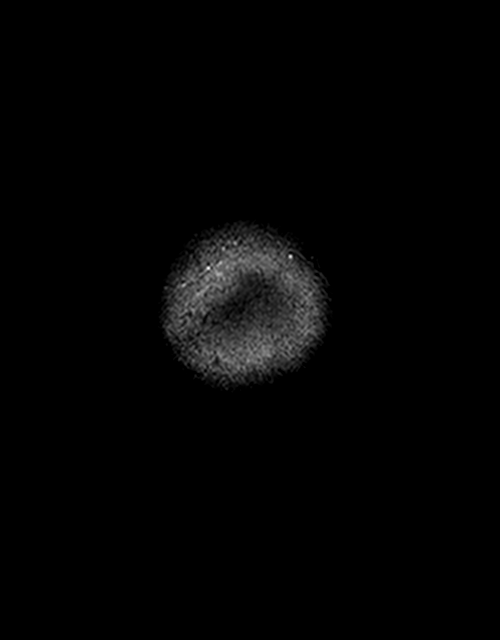

[Series 16: T1 · axial · 1.0mm · 0.90mm/px · z∈[-85,+54]mm · 9 of 144 slices shown (3 of 3)]
[im 1/144]
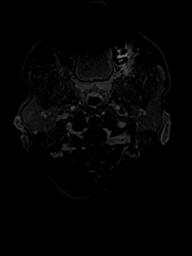
[im 18/144]
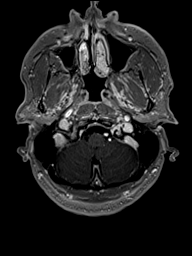
[im 36/144]
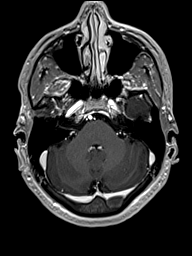
[im 54/144]
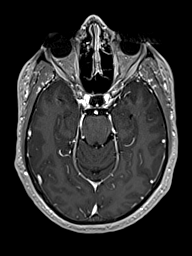
[im 72/144]
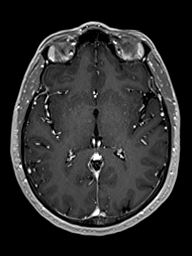
[im 90/144]
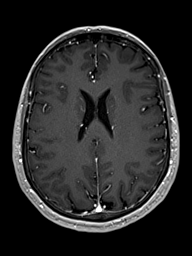
[im 108/144]
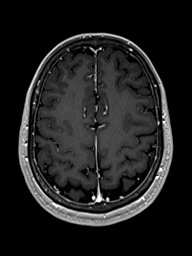
[im 126/144]
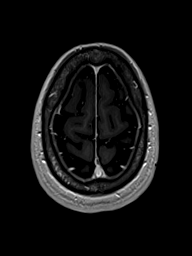
[im 144/144]
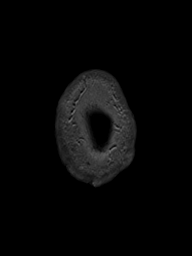

[Series 17: T1 post-contrast · coronal · 4.0mm · 0.72mm/px · 2 of 34 slices shown]
[im 1/34]
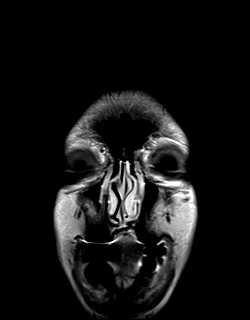
[im 34/34]
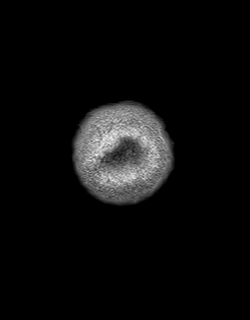

[48 of 48 positions shown; findings below may reference images not displayed]

FINDINGS: Brain: No acute infarction, hemorrhage, hydrocephalus, extra-axial
collection or mass lesion. Normal for age cerebral volume. Small
foci, 2-3 mm diameter, of T2 and FLAIR hyperintense subcortical
white matter signal abnormality, nonspecific and non worrisome,
likely normal for age.

Post infusion, no abnormal enhancement of the brain or meninges.

Vascular: Flow voids are maintained throughout the carotid, basilar,
and vertebral arteries. There are no areas of chronic hemorrhage.
Major dural venous sinuses are patent.

Skull and upper cervical spine: Unremarkable visualized calvarium,
skullbase, and cervical vertebrae. Pituitary, pineal, cerebellar
tonsils unremarkable. No upper cervical cord lesions.

Sinuses/Orbits: No orbital masses or proptosis. Globes appear
symmetric. Sinuses appear well aerated, without evidence for
air-fluid level.

Other: No nasopharyngeal pathology or mastoid fluid. Scalp and other
visualized extracranial soft tissues grossly unremarkable.
IMPRESSION: Normal for age cerebral volume. Minor supratentorial white matter
signal abnormality, also favored to be normal for age.

No acute intracranial findings. No abnormal postcontrast
enhancement.

No intracranial or extracranial cause is seen for dizziness or
headaches.

## 2019-02-09 DIAGNOSIS — R350 Frequency of micturition: Secondary | ICD-10-CM | POA: Diagnosis not present

## 2019-02-09 DIAGNOSIS — R030 Elevated blood-pressure reading, without diagnosis of hypertension: Secondary | ICD-10-CM | POA: Diagnosis not present

## 2019-02-21 DIAGNOSIS — Z0000 Encounter for general adult medical examination without abnormal findings: Secondary | ICD-10-CM | POA: Diagnosis not present

## 2019-02-21 DIAGNOSIS — Z13220 Encounter for screening for lipoid disorders: Secondary | ICD-10-CM | POA: Diagnosis not present

## 2019-03-22 ENCOUNTER — Encounter: Payer: Self-pay | Admitting: Gynecology

## 2019-03-23 DIAGNOSIS — I10 Essential (primary) hypertension: Secondary | ICD-10-CM | POA: Diagnosis not present

## 2019-03-23 DIAGNOSIS — G479 Sleep disorder, unspecified: Secondary | ICD-10-CM | POA: Diagnosis not present

## 2019-03-23 DIAGNOSIS — N3281 Overactive bladder: Secondary | ICD-10-CM | POA: Diagnosis not present

## 2019-03-30 DIAGNOSIS — Z1231 Encounter for screening mammogram for malignant neoplasm of breast: Secondary | ICD-10-CM | POA: Diagnosis not present

## 2019-06-08 ENCOUNTER — Other Ambulatory Visit: Payer: Self-pay

## 2019-06-09 ENCOUNTER — Ambulatory Visit (INDEPENDENT_AMBULATORY_CARE_PROVIDER_SITE_OTHER): Payer: BC Managed Care – PPO

## 2019-06-09 ENCOUNTER — Other Ambulatory Visit: Payer: Self-pay | Admitting: Obstetrics & Gynecology

## 2019-06-09 DIAGNOSIS — Z780 Asymptomatic menopausal state: Secondary | ICD-10-CM

## 2019-06-09 DIAGNOSIS — M8589 Other specified disorders of bone density and structure, multiple sites: Secondary | ICD-10-CM | POA: Diagnosis not present

## 2019-06-09 DIAGNOSIS — M8588 Other specified disorders of bone density and structure, other site: Secondary | ICD-10-CM

## 2019-07-01 DIAGNOSIS — I10 Essential (primary) hypertension: Secondary | ICD-10-CM | POA: Diagnosis not present

## 2019-12-16 DIAGNOSIS — Z13220 Encounter for screening for lipoid disorders: Secondary | ICD-10-CM | POA: Diagnosis not present

## 2019-12-16 DIAGNOSIS — R002 Palpitations: Secondary | ICD-10-CM | POA: Diagnosis not present

## 2019-12-16 DIAGNOSIS — I10 Essential (primary) hypertension: Secondary | ICD-10-CM | POA: Diagnosis not present

## 2019-12-16 DIAGNOSIS — N3281 Overactive bladder: Secondary | ICD-10-CM | POA: Diagnosis not present

## 2019-12-16 DIAGNOSIS — F5101 Primary insomnia: Secondary | ICD-10-CM | POA: Diagnosis not present

## 2019-12-16 DIAGNOSIS — Z1321 Encounter for screening for nutritional disorder: Secondary | ICD-10-CM | POA: Diagnosis not present

## 2020-01-18 DIAGNOSIS — L989 Disorder of the skin and subcutaneous tissue, unspecified: Secondary | ICD-10-CM | POA: Diagnosis not present

## 2020-01-18 DIAGNOSIS — D485 Neoplasm of uncertain behavior of skin: Secondary | ICD-10-CM | POA: Diagnosis not present

## 2020-01-23 DIAGNOSIS — M9901 Segmental and somatic dysfunction of cervical region: Secondary | ICD-10-CM | POA: Diagnosis not present

## 2020-01-23 DIAGNOSIS — M531 Cervicobrachial syndrome: Secondary | ICD-10-CM | POA: Diagnosis not present

## 2020-01-23 DIAGNOSIS — M9902 Segmental and somatic dysfunction of thoracic region: Secondary | ICD-10-CM | POA: Diagnosis not present

## 2020-01-23 DIAGNOSIS — M5032 Other cervical disc degeneration, mid-cervical region, unspecified level: Secondary | ICD-10-CM | POA: Diagnosis not present

## 2020-01-24 DIAGNOSIS — M531 Cervicobrachial syndrome: Secondary | ICD-10-CM | POA: Diagnosis not present

## 2020-01-24 DIAGNOSIS — M9902 Segmental and somatic dysfunction of thoracic region: Secondary | ICD-10-CM | POA: Diagnosis not present

## 2020-01-24 DIAGNOSIS — M5032 Other cervical disc degeneration, mid-cervical region, unspecified level: Secondary | ICD-10-CM | POA: Diagnosis not present

## 2020-01-24 DIAGNOSIS — M9901 Segmental and somatic dysfunction of cervical region: Secondary | ICD-10-CM | POA: Diagnosis not present

## 2020-01-27 DIAGNOSIS — M9902 Segmental and somatic dysfunction of thoracic region: Secondary | ICD-10-CM | POA: Diagnosis not present

## 2020-01-27 DIAGNOSIS — M9901 Segmental and somatic dysfunction of cervical region: Secondary | ICD-10-CM | POA: Diagnosis not present

## 2020-01-27 DIAGNOSIS — M531 Cervicobrachial syndrome: Secondary | ICD-10-CM | POA: Diagnosis not present

## 2020-01-27 DIAGNOSIS — M5032 Other cervical disc degeneration, mid-cervical region, unspecified level: Secondary | ICD-10-CM | POA: Diagnosis not present

## 2020-02-02 DIAGNOSIS — M9902 Segmental and somatic dysfunction of thoracic region: Secondary | ICD-10-CM | POA: Diagnosis not present

## 2020-02-02 DIAGNOSIS — M9901 Segmental and somatic dysfunction of cervical region: Secondary | ICD-10-CM | POA: Diagnosis not present

## 2020-02-02 DIAGNOSIS — M531 Cervicobrachial syndrome: Secondary | ICD-10-CM | POA: Diagnosis not present

## 2020-02-02 DIAGNOSIS — M5032 Other cervical disc degeneration, mid-cervical region, unspecified level: Secondary | ICD-10-CM | POA: Diagnosis not present

## 2020-02-07 DIAGNOSIS — M9902 Segmental and somatic dysfunction of thoracic region: Secondary | ICD-10-CM | POA: Diagnosis not present

## 2020-02-07 DIAGNOSIS — M9901 Segmental and somatic dysfunction of cervical region: Secondary | ICD-10-CM | POA: Diagnosis not present

## 2020-02-07 DIAGNOSIS — M531 Cervicobrachial syndrome: Secondary | ICD-10-CM | POA: Diagnosis not present

## 2020-02-07 DIAGNOSIS — M5032 Other cervical disc degeneration, mid-cervical region, unspecified level: Secondary | ICD-10-CM | POA: Diagnosis not present

## 2020-02-09 DIAGNOSIS — M9901 Segmental and somatic dysfunction of cervical region: Secondary | ICD-10-CM | POA: Diagnosis not present

## 2020-02-09 DIAGNOSIS — M5032 Other cervical disc degeneration, mid-cervical region, unspecified level: Secondary | ICD-10-CM | POA: Diagnosis not present

## 2020-02-09 DIAGNOSIS — M9902 Segmental and somatic dysfunction of thoracic region: Secondary | ICD-10-CM | POA: Diagnosis not present

## 2020-02-09 DIAGNOSIS — M531 Cervicobrachial syndrome: Secondary | ICD-10-CM | POA: Diagnosis not present

## 2020-02-13 DIAGNOSIS — G479 Sleep disorder, unspecified: Secondary | ICD-10-CM | POA: Diagnosis not present

## 2020-02-14 DIAGNOSIS — M9902 Segmental and somatic dysfunction of thoracic region: Secondary | ICD-10-CM | POA: Diagnosis not present

## 2020-02-14 DIAGNOSIS — M531 Cervicobrachial syndrome: Secondary | ICD-10-CM | POA: Diagnosis not present

## 2020-02-14 DIAGNOSIS — M5032 Other cervical disc degeneration, mid-cervical region, unspecified level: Secondary | ICD-10-CM | POA: Diagnosis not present

## 2020-02-14 DIAGNOSIS — M9901 Segmental and somatic dysfunction of cervical region: Secondary | ICD-10-CM | POA: Diagnosis not present

## 2020-02-23 DIAGNOSIS — M5032 Other cervical disc degeneration, mid-cervical region, unspecified level: Secondary | ICD-10-CM | POA: Diagnosis not present

## 2020-02-23 DIAGNOSIS — M9901 Segmental and somatic dysfunction of cervical region: Secondary | ICD-10-CM | POA: Diagnosis not present

## 2020-02-23 DIAGNOSIS — M531 Cervicobrachial syndrome: Secondary | ICD-10-CM | POA: Diagnosis not present

## 2020-02-23 DIAGNOSIS — M9902 Segmental and somatic dysfunction of thoracic region: Secondary | ICD-10-CM | POA: Diagnosis not present

## 2020-03-06 DIAGNOSIS — M5032 Other cervical disc degeneration, mid-cervical region, unspecified level: Secondary | ICD-10-CM | POA: Diagnosis not present

## 2020-03-06 DIAGNOSIS — M9902 Segmental and somatic dysfunction of thoracic region: Secondary | ICD-10-CM | POA: Diagnosis not present

## 2020-03-06 DIAGNOSIS — M9901 Segmental and somatic dysfunction of cervical region: Secondary | ICD-10-CM | POA: Diagnosis not present

## 2020-03-06 DIAGNOSIS — M531 Cervicobrachial syndrome: Secondary | ICD-10-CM | POA: Diagnosis not present

## 2020-03-08 DIAGNOSIS — M531 Cervicobrachial syndrome: Secondary | ICD-10-CM | POA: Diagnosis not present

## 2020-03-08 DIAGNOSIS — M5032 Other cervical disc degeneration, mid-cervical region, unspecified level: Secondary | ICD-10-CM | POA: Diagnosis not present

## 2020-03-08 DIAGNOSIS — M9901 Segmental and somatic dysfunction of cervical region: Secondary | ICD-10-CM | POA: Diagnosis not present

## 2020-03-08 DIAGNOSIS — M9902 Segmental and somatic dysfunction of thoracic region: Secondary | ICD-10-CM | POA: Diagnosis not present

## 2020-03-13 DIAGNOSIS — M5032 Other cervical disc degeneration, mid-cervical region, unspecified level: Secondary | ICD-10-CM | POA: Diagnosis not present

## 2020-03-13 DIAGNOSIS — M9902 Segmental and somatic dysfunction of thoracic region: Secondary | ICD-10-CM | POA: Diagnosis not present

## 2020-03-13 DIAGNOSIS — M531 Cervicobrachial syndrome: Secondary | ICD-10-CM | POA: Diagnosis not present

## 2020-03-13 DIAGNOSIS — M9901 Segmental and somatic dysfunction of cervical region: Secondary | ICD-10-CM | POA: Diagnosis not present

## 2020-03-15 DIAGNOSIS — Z1159 Encounter for screening for other viral diseases: Secondary | ICD-10-CM | POA: Diagnosis not present

## 2020-03-20 DIAGNOSIS — D124 Benign neoplasm of descending colon: Secondary | ICD-10-CM | POA: Diagnosis not present

## 2020-03-20 DIAGNOSIS — Z86010 Personal history of colonic polyps: Secondary | ICD-10-CM | POA: Diagnosis not present

## 2020-03-20 DIAGNOSIS — K640 First degree hemorrhoids: Secondary | ICD-10-CM | POA: Diagnosis not present

## 2020-03-20 DIAGNOSIS — D122 Benign neoplasm of ascending colon: Secondary | ICD-10-CM | POA: Diagnosis not present

## 2020-03-22 DIAGNOSIS — M531 Cervicobrachial syndrome: Secondary | ICD-10-CM | POA: Diagnosis not present

## 2020-03-22 DIAGNOSIS — M5032 Other cervical disc degeneration, mid-cervical region, unspecified level: Secondary | ICD-10-CM | POA: Diagnosis not present

## 2020-03-22 DIAGNOSIS — M9902 Segmental and somatic dysfunction of thoracic region: Secondary | ICD-10-CM | POA: Diagnosis not present

## 2020-03-22 DIAGNOSIS — M9901 Segmental and somatic dysfunction of cervical region: Secondary | ICD-10-CM | POA: Diagnosis not present

## 2020-03-26 DIAGNOSIS — G4721 Circadian rhythm sleep disorder, delayed sleep phase type: Secondary | ICD-10-CM | POA: Diagnosis not present

## 2020-03-26 DIAGNOSIS — G4700 Insomnia, unspecified: Secondary | ICD-10-CM | POA: Diagnosis not present

## 2020-03-26 DIAGNOSIS — R400 Somnolence: Secondary | ICD-10-CM | POA: Diagnosis not present

## 2020-03-26 DIAGNOSIS — R0683 Snoring: Secondary | ICD-10-CM | POA: Diagnosis not present

## 2020-03-27 DIAGNOSIS — R350 Frequency of micturition: Secondary | ICD-10-CM | POA: Diagnosis not present

## 2020-03-29 DIAGNOSIS — M9901 Segmental and somatic dysfunction of cervical region: Secondary | ICD-10-CM | POA: Diagnosis not present

## 2020-03-29 DIAGNOSIS — M531 Cervicobrachial syndrome: Secondary | ICD-10-CM | POA: Diagnosis not present

## 2020-03-29 DIAGNOSIS — M5032 Other cervical disc degeneration, mid-cervical region, unspecified level: Secondary | ICD-10-CM | POA: Diagnosis not present

## 2020-03-29 DIAGNOSIS — M9902 Segmental and somatic dysfunction of thoracic region: Secondary | ICD-10-CM | POA: Diagnosis not present

## 2020-03-30 DIAGNOSIS — Z1231 Encounter for screening mammogram for malignant neoplasm of breast: Secondary | ICD-10-CM | POA: Diagnosis not present

## 2020-04-09 DIAGNOSIS — H02889 Meibomian gland dysfunction of unspecified eye, unspecified eyelid: Secondary | ICD-10-CM | POA: Diagnosis not present

## 2020-05-23 DIAGNOSIS — R400 Somnolence: Secondary | ICD-10-CM | POA: Diagnosis not present

## 2020-05-23 DIAGNOSIS — R0683 Snoring: Secondary | ICD-10-CM | POA: Diagnosis not present

## 2020-05-23 DIAGNOSIS — G4721 Circadian rhythm sleep disorder, delayed sleep phase type: Secondary | ICD-10-CM | POA: Diagnosis not present

## 2020-06-20 DIAGNOSIS — H02889 Meibomian gland dysfunction of unspecified eye, unspecified eyelid: Secondary | ICD-10-CM | POA: Diagnosis not present

## 2020-06-28 ENCOUNTER — Encounter: Payer: Self-pay | Admitting: Plastic Surgery

## 2020-06-28 ENCOUNTER — Other Ambulatory Visit: Payer: Self-pay

## 2020-06-28 ENCOUNTER — Ambulatory Visit (INDEPENDENT_AMBULATORY_CARE_PROVIDER_SITE_OTHER): Payer: Self-pay | Admitting: Plastic Surgery

## 2020-06-28 VITALS — BP 151/85 | HR 57 | Temp 98.6°F | Ht 59.75 in | Wt 141.6 lb

## 2020-06-28 DIAGNOSIS — Z411 Encounter for cosmetic surgery: Secondary | ICD-10-CM

## 2020-06-28 NOTE — Progress Notes (Signed)
Referring Provider Sigmund Hazel, MD [address] [place],  [place] [address]   CC:  Chief Complaint  Patient presents with  . Advice Only      Lori Snyder is an 60 y.o. female.  HPI: Patient presents to discuss concerns about her breast implants.  She had subglandular saline implants placed in 2004 by Dr. Stephens November.  She is developed some pain and firmness on the right side and is considering having her implants removed.  She is uncertain if she wants new implants replaced.  She is uncertain of the size that was used previously.  She is up-to-date on her mammograms and they have been normal.  Allergies  Allergen Reactions  . Asa [Aspirin] Other (See Comments)    Stomach pain    Outpatient Encounter Medications as of 06/28/2020  Medication Sig  . Ascorbic Acid (VITAMIN C) 1000 MG tablet Take 1,000 mg by mouth daily.  . Calcium 280 MG TABS Take by mouth.  . cholecalciferol (VITAMIN D3) 25 MCG (1000 UT) tablet Take 1,000 Units by mouth daily.  Marland Kitchen losartan (COZAAR) 50 MG tablet Take 1 tablet by mouth daily.  . [DISCONTINUED] BIOTIN PO Take by mouth.  . [DISCONTINUED] magnesium 30 MG tablet Take 30 mg by mouth 2 (two) times daily.   No facility-administered encounter medications on file as of 06/28/2020.     Past Medical History:  Diagnosis Date  . Osteopenia 05/2017   T score -1.1 FRAX 2.8% / 0.2%    Past Surgical History:  Procedure Laterality Date  . APPENDECTOMY    . AUGMENTATION MAMMAPLASTY    . CESAREAN SECTION      Family History  Problem Relation Age of Onset  . Cancer Father        Prostate  . Heart failure Paternal Grandmother     Social History   Social History Narrative  . Not on file  Denies tobacco use  Review of Systems General: Denies fevers, chills, weight loss CV: Denies chest pain, shortness of breath, palpitations  Physical Exam Vitals with BMI 06/28/2020 09/24/2018 02/12/2018  Height 4' 11.75" 4\' 11"  -  Weight 141 lbs 10 oz 144 lbs -   BMI 27.87 29.07 -  Systolic 151 140  Diastolic 85 88 82  Pulse 57 - -    General:  No acute distress,  Alert and oriented, Non-Toxic, Normal speech and affect Examination shows bilateral breast augmentation.  These were put in through periareolar incision that has healed nicely on both sides.  She is overall fairly symmetric with the implants in good position.  There has been very little ptosis of breast tissue off the implant.  On the right side the capsule is a bit firm and has elevated the implant slightly compared to the left.  She is a little bit tender on the right side as well.  I do not appreciate any other scars or masses.  Base width is 11.5 cm.  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  We discussed removing the implants and performing capsulectomy plus or minus replacement of new implants.  She is leaning towards just having the implants removed but is still considering replacement.  I explained if she wanted we could simulate what it would be like to have the implants removed by popping the implants presurgery and then she will get a sense for what she would look like and feel like.  She wants to think  about that.  I went through the risks of implant removal including bleeding, infection, damage to surrounding structures and need for additional procedures.  We discussed the pros and cons of implant replacement and if she were to do that she would want saline.  I explained the need for a drain postoperatively due to the capsule ectomy.  She is interested in moving forward with implant removal and wants to think about the replacement side of things.  We will give her quotes for both.  All of her questions were answered.  Allena Napoleon 06/28/2020, 2:02 PM

## 2020-07-05 ENCOUNTER — Telehealth: Payer: Self-pay | Admitting: Plastic Surgery

## 2020-07-05 NOTE — Telephone Encounter (Signed)
Received call from patient to go over the adjusted quote for the cosmetic surgery. The fees were discussed based on a bilateral removal of implants with a unilateral capsulectomy, as well as the associated OR and anesthesia fees. Lori Snyder stated that she will take some time to think about the quote and let us know when she is ready to proceed.

## 2020-07-09 DIAGNOSIS — R0683 Snoring: Secondary | ICD-10-CM | POA: Diagnosis not present

## 2020-07-09 DIAGNOSIS — G4721 Circadian rhythm sleep disorder, delayed sleep phase type: Secondary | ICD-10-CM | POA: Diagnosis not present

## 2020-07-27 DIAGNOSIS — N3281 Overactive bladder: Secondary | ICD-10-CM | POA: Diagnosis not present

## 2020-07-27 DIAGNOSIS — I10 Essential (primary) hypertension: Secondary | ICD-10-CM | POA: Diagnosis not present

## 2020-07-27 DIAGNOSIS — M8580 Other specified disorders of bone density and structure, unspecified site: Secondary | ICD-10-CM | POA: Diagnosis not present

## 2020-07-27 DIAGNOSIS — G4721 Circadian rhythm sleep disorder, delayed sleep phase type: Secondary | ICD-10-CM | POA: Diagnosis not present

## 2020-08-27 DIAGNOSIS — H02889 Meibomian gland dysfunction of unspecified eye, unspecified eyelid: Secondary | ICD-10-CM | POA: Diagnosis not present

## 2020-09-26 DIAGNOSIS — Z45812 Encounter for adjustment or removal of left breast implant: Secondary | ICD-10-CM | POA: Diagnosis not present

## 2020-09-26 DIAGNOSIS — Z01810 Encounter for preprocedural cardiovascular examination: Secondary | ICD-10-CM | POA: Diagnosis not present

## 2020-09-26 DIAGNOSIS — I10 Essential (primary) hypertension: Secondary | ICD-10-CM | POA: Diagnosis not present

## 2020-09-26 DIAGNOSIS — Z01812 Encounter for preprocedural laboratory examination: Secondary | ICD-10-CM | POA: Diagnosis not present

## 2020-11-17 DIAGNOSIS — Z20822 Contact with and (suspected) exposure to covid-19: Secondary | ICD-10-CM | POA: Diagnosis not present

## 2020-11-24 DIAGNOSIS — Z20822 Contact with and (suspected) exposure to covid-19: Secondary | ICD-10-CM | POA: Diagnosis not present

## 2020-12-17 DIAGNOSIS — Z0000 Encounter for general adult medical examination without abnormal findings: Secondary | ICD-10-CM | POA: Diagnosis not present

## 2020-12-17 DIAGNOSIS — I10 Essential (primary) hypertension: Secondary | ICD-10-CM | POA: Diagnosis not present

## 2021-01-16 DIAGNOSIS — Z20822 Contact with and (suspected) exposure to covid-19: Secondary | ICD-10-CM | POA: Diagnosis not present

## 2021-03-01 ENCOUNTER — Ambulatory Visit: Payer: BC Managed Care – PPO | Admitting: Obstetrics & Gynecology

## 2021-04-05 DIAGNOSIS — Z1231 Encounter for screening mammogram for malignant neoplasm of breast: Secondary | ICD-10-CM | POA: Diagnosis not present

## 2021-04-09 DIAGNOSIS — H02889 Meibomian gland dysfunction of unspecified eye, unspecified eyelid: Secondary | ICD-10-CM | POA: Diagnosis not present

## 2021-05-02 ENCOUNTER — Ambulatory Visit (INDEPENDENT_AMBULATORY_CARE_PROVIDER_SITE_OTHER): Payer: BC Managed Care – PPO | Admitting: Obstetrics & Gynecology

## 2021-05-02 ENCOUNTER — Other Ambulatory Visit (HOSPITAL_COMMUNITY)
Admission: RE | Admit: 2021-05-02 | Discharge: 2021-05-02 | Disposition: A | Discharge status: Home / Self Care | Payer: BC Managed Care – PPO | Source: Ambulatory Visit | Attending: Obstetrics & Gynecology | Admitting: Obstetrics & Gynecology

## 2021-05-02 ENCOUNTER — Encounter: Payer: Self-pay | Admitting: Obstetrics & Gynecology

## 2021-05-02 ENCOUNTER — Other Ambulatory Visit: Payer: Self-pay

## 2021-05-02 VITALS — BP 120/82 | HR 76 | Resp 16 | Ht 58.75 in | Wt 139.0 lb

## 2021-05-02 DIAGNOSIS — N870 Mild cervical dysplasia: Secondary | ICD-10-CM | POA: Diagnosis not present

## 2021-05-02 DIAGNOSIS — Z01419 Encounter for gynecological examination (general) (routine) without abnormal findings: Secondary | ICD-10-CM | POA: Insufficient documentation

## 2021-05-02 DIAGNOSIS — M8589 Other specified disorders of bone density and structure, multiple sites: Secondary | ICD-10-CM | POA: Diagnosis not present

## 2021-05-02 DIAGNOSIS — Z780 Asymptomatic menopausal state: Secondary | ICD-10-CM | POA: Diagnosis not present

## 2021-05-02 NOTE — Progress Notes (Signed)
Lori Snyder 04-22-1961 [id number]   History:    60 y.o. G3P2A1L2  Widowed   RP:  Established patient presenting for annual gyn exam    HPI: Postmenopause, well on no HRT.  No PMB.  No pelvic pain.  Not currently sexually active.  Pap ASCUS/HPV HR Neg in 2020.  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  Urine and bowel movements normal.  Status post removal of bilateral breast implants 09/2020. Screening mammo 2022 [facility], will obtain report.  Body mass index 28.31 with a very good muscle mass.  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  Colono 2021.  BD very mild Osteopenia 05/2019, will repeat at 3 yrs next year.   Past medical history,surgical history, family history and social history were all reviewed and documented in the EPIC chart.  Gynecologic History No LMP recorded. Patient is postmenopausal.  Obstetric History OB History  Gravida Para Term Preterm AB Living  3 2     1 2   SAB IAB Ectopic Multiple Live Births    1          # Outcome Date GA Lbr Len/2nd Weight Sex Delivery Anes PTL Lv  3 IAB           2 Para           1 Para              ROS: A ROS was performed and pertinent positives and negatives are included in the history.  GENERAL: No fevers or chills. HEENT: No change in vision, no earache, sore throat or sinus congestion. NECK: No pain or stiffness. CARDIOVASCULAR: No chest pain or pressure. No palpitations. PULMONARY: No shortness of breath, cough or wheeze. GASTROINTESTINAL: No abdominal pain, nausea, vomiting or diarrhea, melena or bright red blood per rectum. GENITOURINARY: No urinary frequency, urgency, hesitancy or dysuria. MUSCULOSKELETAL: No joint or muscle pain, no back pain, no recent trauma. DERMATOLOGIC: No rash, no itching, no lesions. ENDOCRINE: No polyuria, polydipsia, no heat or cold intolerance. No recent change in weight. HEMATOLOGICAL: No anemia or easy bruising or bleeding. NEUROLOGIC: No headache, seizures, numbness, tingling or weakness.  PSYCHIATRIC: No depression, no loss of interest in normal activity or change in sleep pattern.     Exam:   BP 120/82   Pulse 76   Resp 16   Ht 4' 10.75" (1.492 m)   Wt 139 lb (63 kg)   BMI 28.31 kg/m   Body mass index is 28.31 kg/m.  General appearance : Well developed well nourished female. No acute distress HEENT: Eyes: no retinal hemorrhage or exudates,  Neck supple, trachea midline, no carotid bruits, no thyroidmegaly Lungs: Clear to auscultation, no rhonchi or wheezes, or rib retractions  Heart: Regular rate and rhythm, no murmurs or gallops Breast:Examined in sitting and supine position were symmetrical in appearance, no palpable masses or tenderness,  no skin retraction, no nipple inversion, no nipple discharge, no skin discoloration, no axillary or supraclavicular lymphadenopathy Abdomen: no palpable masses or tenderness, no rebound or guarding Extremities: no edema or skin discoloration or tenderness  Pelvic: Vulva: Normal             Vagina: No gross lesions or discharge  Cervix: No gross lesions or discharge.  Pap/HPV HR done.  Uterus  AV, normal size, shape and consistency, non-tender and mobile  Adnexa  Without masses or tenderness  Anus: Normal   Assessment/Plan:  60 y.o. female for annual  exam   1. Encounter for routine gynecological examination with Papanicolaou smear of cervix  Postmenopause, well on no HRT.  No PMB.  No pelvic pain.  Not currently sexually active.  Pap ASCUS/HPV HR Neg in 2020.  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  Pap/HPV HR done today.  Urine and bowel movements normal.  Status post removal of bilateral breast implants 09/2020. Screening mammo 2022 [facility], will obtain report.  Body mass index 28.31 with a very good muscle mass.  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  Colono 2021.  BD very mild Osteopenia 05/2019, will repeat at 3 yrs next year.  - Cytology - PAP( [place])  2. Dysplasia of cervix, low grade (CIN  1) Pap/HPV HR done.  3. Postmenopause Well on no HRT, no PMB.  4. Osteopenia of multiple sites BD very mild Osteopenia 05/2019, will repeat at 3 yrs next year.  Other orders - oxybutynin (DITROPAN-XL) 5 MG 24 hr tablet; Take 5 mg by mouth daily as needed.   [name], 9:40 AM 05/02/2021

## 2021-05-06 LAB — CYTOLOGY - PAP
Comment: NEGATIVE
Diagnosis: NEGATIVE
High risk HPV: NEGATIVE

## 2021-07-02 DIAGNOSIS — R350 Frequency of micturition: Secondary | ICD-10-CM | POA: Diagnosis not present

## 2021-07-02 DIAGNOSIS — N3281 Overactive bladder: Secondary | ICD-10-CM | POA: Diagnosis not present

## 2021-08-27 DIAGNOSIS — H1013 Acute atopic conjunctivitis, bilateral: Secondary | ICD-10-CM | POA: Diagnosis not present

## 2021-10-10 DIAGNOSIS — H02889 Meibomian gland dysfunction of unspecified eye, unspecified eyelid: Secondary | ICD-10-CM | POA: Diagnosis not present

## 2021-10-20 DIAGNOSIS — N3001 Acute cystitis with hematuria: Secondary | ICD-10-CM | POA: Diagnosis not present

## 2021-10-21 ENCOUNTER — Ambulatory Visit: Payer: BC Managed Care – PPO | Admitting: Nurse Practitioner

## 2021-10-21 VITALS — BP 120/70

## 2021-10-21 DIAGNOSIS — N3001 Acute cystitis with hematuria: Secondary | ICD-10-CM | POA: Diagnosis not present

## 2021-10-21 NOTE — Progress Notes (Signed)
? ?  Acute Office Visit ? ?Subjective:  ? ? Patient ID: Lori Snyder, female    DOB: 1960/07/22, 61 y.o.   MRN: [id number] ? ? ?HPI 61 y.o. presents today for bleeding. Treated for UTI yesterday at Urgent Care. She had large amount of RBCs in urine and they were unable to perform pelvic exam, so they recommended she been seen for evaluation to rule out vaginal bleeding. She had small amount of blood on tissue with wiping. This began the same time her urinary symptoms started. She is currently on Macrobid, symptoms have slightly improved. Culture pending. Not sexually active.  ? ? ?Review of Systems  ?Constitutional: Negative.   ?Genitourinary:  Positive for dysuria, frequency, hematuria and urgency. Negative for difficulty urinating, flank pain, vaginal bleeding, vaginal discharge and vaginal pain.  ? ?   ?Objective:  ?  ?Physical Exam ?Constitutional:   ?   Appearance: Normal appearance.  ?Genitourinary: ?   General: Normal vulva.  ?   Vagina: Normal.  ?   Cervix: Normal.  ?   Uterus: Normal.   ?   Comments: Atrophic changes, no evidence of bleeding ? ? ?BP 120/70  ?Wt Readings from Last 3 Encounters:  ?05/02/21 139 lb (63 kg)  ?06/28/20 141 lb 9.6 oz (64.2 kg)  ?09/24/18 144 lb (65.3 kg)  ? ? ?   ? ?Patient informed chaperone available to be present for breast and pelvic exam. Patient has requested no chaperone to be present. Patient has been advised what will be completed during breast and pelvic exam.  ? ?Assessment & Plan:  ? ?Problem List Items Addressed This Visit   ?None ?Visit Diagnoses   ? ? Acute cystitis with hematuria    -  Primary  ? Relevant Orders  ? Urinalysis w microscopic + reflex cultur  ? ?  ? ?Plan: No evidence of vaginal bleeding today. Blood likely from UTI. Finish full course of antibiotics, UC should call with culture results. If symptoms worsen or return she will follow up here. Will return in 4 weeks for urinalysis to check resolution of hematuria. She is agreeable to plan.   ? ? ? ? ?Olivia Mackie DNP, 10:29 AM 10/21/2021 ? ?

## 2021-10-23 ENCOUNTER — Ambulatory Visit: Payer: BC Managed Care – PPO | Admitting: Obstetrics & Gynecology

## 2021-10-23 ENCOUNTER — Encounter: Payer: Self-pay | Admitting: Obstetrics & Gynecology

## 2021-10-23 VITALS — BP 120/80 | HR 70 | Temp 99.2°F

## 2021-10-23 DIAGNOSIS — R102 Pelvic and perineal pain: Secondary | ICD-10-CM | POA: Diagnosis not present

## 2021-10-23 DIAGNOSIS — R310 Gross hematuria: Secondary | ICD-10-CM

## 2021-10-23 LAB — WET PREP FOR TRICH, YEAST, CLUE

## 2021-10-23 MED ORDER — CIPROFLOXACIN HCL 500 MG PO TABS: 500.0000 mg | ORAL_TABLET | Freq: Two times a day (BID) | ORAL | 0 refills | Status: AC

## 2021-10-23 NOTE — Progress Notes (Signed)
? ? ?  Lori Snyder 12-05-1960 [id number] ? ? ?     61 y.o.  [id number]  ? ?RP: Blood in urine with pelvic discomfort ? ?HPI: Urine Culture at [facility] Low Count.  Still on ABTx with Bactrim DS.  Hematuria persists, but lighter.  Mild pelvic discomfort.  No fever.  BMs normal. ? ? ?OB History  ?Gravida Para Term Preterm AB Living  ?3 2     1 2   ?SAB IAB Ectopic Multiple Live Births  ?  1        ?  ?# Outcome Date GA Lbr Len/2nd Weight Sex Delivery Anes PTL Lv  ?3 IAB           ?2 Para           ?1 Para           ? ? ?Past medical history,surgical history, problem list, medications, allergies, family history and social history were all reviewed and documented in the EPIC chart. ? ? ?Directed ROS with pertinent positives and negatives documented in the history of present illness/assessment and plan. ? ?Exam: ? ?Vitals:  ? 10/23/21 1613  ?BP: 120/80  ?Pulse: 70  ?Temp: 99.2 ?F (37.3 ?C)  ?TempSrc: Oral  ? ?General appearance:  Normal ? ?CVAT Neg ? ?Abdo:  Normal ? ?Gynecologic exam: Vulva normal.  Speculum:  Cervix/Vagina normal.  Mild vaginal secretions.  Wet prep done. ? ?Wet prep: Neg ?U/A: Yellow cloudy, Protein Neg, Nit Neg, WBC 40-60, RBC 20-40, Bacteria moderate.  Calcium oxalate Moderate.  Urine Culture pending. ? ? ?Assessment/Plan:  61 y.o. 64  ? ?1. Gross hematuria ?Urine Culture at [facility] Low Count.  Still on ABTx with Bactrim DS.  Hematuria persists, but lighter.  U/A abnormal c/w an Acute Cystitis.  Will treat with Cipro 500 mg PO BID x 7 days. Usage reviewed and prescription sent to pharmacy. ?- [facility] Transvaginal Non-OB; Future ?- Urinalysis,Complete w/RFL Culture ? ?2. Pelvic pain in female ?Wet prep Neg.  Will further investigate with a Pelvic US at f/u. ?- US Transvaginal Non-OB; Future ?- WET PREP FOR TRICH, YEAST, CLUE ? ?Other orders ?- ciprofloxacin (CIPRO) 500 MG tablet; Take 1 tablet (500 mg total) by mouth 2 (two) times daily for 7 days.  ? ?[name] MD, 4:20 PM 10/23/2021 ? ? ? ?   ?

## 2021-10-26 ENCOUNTER — Encounter: Payer: Self-pay | Admitting: Obstetrics & Gynecology

## 2021-10-26 LAB — URINALYSIS, COMPLETE W/RFL CULTURE
Bilirubin Urine: NEGATIVE
Glucose, UA: NEGATIVE
Hyaline Cast: NONE SEEN /LPF
Ketones, ur: NEGATIVE
Nitrites, Initial: NEGATIVE
Protein, ur: NEGATIVE
Specific Gravity, Urine: 1.02 (ref 1.001–1.035)
pH: 5 (ref 5.0–8.0)

## 2021-10-26 LAB — CULTURE INDICATED

## 2021-10-26 LAB — URINE CULTURE
MICRO NUMBER:: 13345973
SPECIMEN QUALITY:: ADEQUATE

## 2021-10-27 ENCOUNTER — Encounter: Payer: Self-pay | Admitting: Obstetrics & Gynecology

## 2021-10-28 ENCOUNTER — Telehealth: Payer: Self-pay

## 2021-10-28 NOTE — Telephone Encounter (Signed)
Hello Dr. Seymour Bars,  ? ?Please call me and explain what the results of the urine test mean and what I need to do going forward.    ? ?Thank you,  ? ?Lori Snyder  ?

## 2021-10-28 NOTE — Telephone Encounter (Signed)
Spoke with patient and advised

## 2021-10-28 NOTE — Telephone Encounter (Signed)
Genia Del, MD  You 8 minutes ago (9:05 AM)  ? ?U. Culture Citrobacter Freundi.  Sensitive to Cipro.  Finish the Cipro treatment as prescribed.   ? ?

## 2021-11-01 DIAGNOSIS — I10 Essential (primary) hypertension: Secondary | ICD-10-CM | POA: Diagnosis not present

## 2021-11-01 DIAGNOSIS — N3281 Overactive bladder: Secondary | ICD-10-CM | POA: Diagnosis not present

## 2021-11-01 DIAGNOSIS — G4700 Insomnia, unspecified: Secondary | ICD-10-CM | POA: Diagnosis not present

## 2021-11-08 DIAGNOSIS — I10 Essential (primary) hypertension: Secondary | ICD-10-CM | POA: Diagnosis not present

## 2021-11-19 ENCOUNTER — Other Ambulatory Visit: Payer: BC Managed Care – PPO

## 2021-12-05 ENCOUNTER — Other Ambulatory Visit: Payer: BC Managed Care – PPO | Admitting: Obstetrics & Gynecology

## 2021-12-05 ENCOUNTER — Other Ambulatory Visit: Payer: BC Managed Care – PPO

## 2022-01-16 ENCOUNTER — Ambulatory Visit: Payer: BC Managed Care – PPO

## 2022-01-16 ENCOUNTER — Encounter: Payer: Self-pay | Admitting: Obstetrics & Gynecology

## 2022-01-16 ENCOUNTER — Other Ambulatory Visit: Payer: Self-pay | Admitting: Obstetrics & Gynecology

## 2022-01-16 ENCOUNTER — Ambulatory Visit (INDEPENDENT_AMBULATORY_CARE_PROVIDER_SITE_OTHER): Payer: BC Managed Care – PPO | Admitting: Obstetrics & Gynecology

## 2022-01-16 VITALS — BP 110/72 | HR 70

## 2022-01-16 DIAGNOSIS — R310 Gross hematuria: Secondary | ICD-10-CM

## 2022-01-16 DIAGNOSIS — R9389 Abnormal findings on diagnostic imaging of other specified body structures: Secondary | ICD-10-CM | POA: Diagnosis not present

## 2022-01-16 DIAGNOSIS — R102 Pelvic and perineal pain: Secondary | ICD-10-CM

## 2022-01-16 NOTE — Progress Notes (Signed)
    Lori Snyder 28-Feb-1961 [id number]        61 y.o.  [id number]   RP: Pelvic Pain/PMB for Pelvic US  HPI: Seen 10/23/2021 with PMB, from bladder or vagina.  Had an acute Cystitis to Citrobacter Freundii treated with Cipro at that time.  No further bleeding after treatment.  No current pelvic pain.   OB History  Gravida Para Term Preterm AB Living  3 2     1 2   SAB IAB Ectopic Multiple Live Births    1          # Outcome Date GA Lbr Len/2nd Weight Sex Delivery Anes PTL Lv  3 IAB           2 Para           1 Para             Past medical history,surgical history, problem list, medications, allergies, family history and social history were all reviewed and documented in the EPIC chart.   Directed ROS with pertinent positives and negatives documented in the history of present illness/assessment and plan.  Exam:  Vitals:   01/16/22 0943  BP: 110/72  Pulse: 70   General appearance:  Normal  Pelvic 01/18/22 today: Limited transabdominal and transvaginal images.  Urinary bladder appears normal with bilateral ureters patent, ureteral jets visualized, no debris or mass seen.  Anteverted uterus normal in size and shape with multiple small intramural fibroids noted.  The uterine size is measured at 7.38 x 4.04 x 4.13 cm.  The largest of the 3 fibroids measured was 1.63 cm.  Very suboptimal visualization of the endometrial canal which appears thickened measured at approximately 9.9 mm.  Both ovaries are atrophic in appearance and normal, with positive perfusion bilaterally.  No adnexal mass.  No free fluid in the pelvis.   Assessment/Plan:  61 y.o. 64   1. Pelvic pain in female Seen 10/23/2021 with PMB, from bladder or vagina.  Had an acute Cystitis to Citrobacter Freundii treated with Cipro at that time.  No further bleeding after treatment.  No current pelvic pain.  Pelvic 12/23/2021 findings thoroughly reviewed with patient.  The endometrial line is thickened to approximately 9.9 mm, but  sub-optimal visualization.  Very small uterine fibroids.  Normal bilateral ovaries.  No FF.  Decision to further investigate with a Sonohysto/possible EBx.  Patient voiced understanding and agreement with plan. - [name] Sonohysterogram; Future  2. Thickened endometrium The endometrial line is thickened to approximately 9.9 mm, but sub-optimal visualization.  Very small uterine fibroids. Decision to further investigate with a Sonohysto/possible EBx.  Patient voiced understanding and agreement with plan. - [name] Sonohysterogram; Future  Other orders - MYRBETRIQ 25 MG TB24 tablet; Take 25 mg by mouth as needed. - VITAMIN D PO; Take by mouth. - Ascorbic Acid (VITAMIN C PO); Take by mouth. - Coenzyme Q10 (CO Q 10 PO); Take by mouth. - Multiple Vitamin (MULTIVITAMIN PO); Take by mouth.   Counseling and management of postmenopausal bleeding with thickened endometrial lining, documentation reviewed, sonohysterogram and endometrial biopsy procedures explained for 15 minutes.  [name] MD, 10:06 AM 01/16/2022

## 2022-02-07 DIAGNOSIS — F5101 Primary insomnia: Secondary | ICD-10-CM | POA: Diagnosis not present

## 2022-02-07 DIAGNOSIS — N3281 Overactive bladder: Secondary | ICD-10-CM | POA: Diagnosis not present

## 2022-02-07 DIAGNOSIS — I10 Essential (primary) hypertension: Secondary | ICD-10-CM | POA: Diagnosis not present

## 2022-02-10 ENCOUNTER — Encounter: Payer: Self-pay | Admitting: Obstetrics & Gynecology

## 2022-02-13 ENCOUNTER — Other Ambulatory Visit: Payer: BC Managed Care – PPO

## 2022-02-13 ENCOUNTER — Ambulatory Visit: Payer: BC Managed Care – PPO | Admitting: Obstetrics & Gynecology

## 2022-02-13 ENCOUNTER — Encounter: Payer: Self-pay | Admitting: Obstetrics & Gynecology

## 2022-02-13 ENCOUNTER — Other Ambulatory Visit: Payer: BC Managed Care – PPO | Admitting: Obstetrics & Gynecology

## 2022-02-13 ENCOUNTER — Ambulatory Visit (INDEPENDENT_AMBULATORY_CARE_PROVIDER_SITE_OTHER): Payer: BC Managed Care – PPO

## 2022-02-13 ENCOUNTER — Other Ambulatory Visit (HOSPITAL_COMMUNITY)
Admission: RE | Admit: 2022-02-13 | Discharge: 2022-02-13 | Disposition: A | Discharge status: Home / Self Care | Payer: BC Managed Care – PPO | Source: Ambulatory Visit | Attending: Obstetrics & Gynecology | Admitting: Obstetrics & Gynecology

## 2022-02-13 VITALS — BP 116/72 | HR 55

## 2022-02-13 DIAGNOSIS — R9389 Abnormal findings on diagnostic imaging of other specified body structures: Secondary | ICD-10-CM

## 2022-02-13 DIAGNOSIS — N858 Other specified noninflammatory disorders of uterus: Secondary | ICD-10-CM | POA: Diagnosis not present

## 2022-02-13 DIAGNOSIS — R102 Pelvic and perineal pain: Secondary | ICD-10-CM

## 2022-02-13 NOTE — Progress Notes (Signed)
Lori Snyder March 23, 1961 [id number]        61 y.o.  [id number]   RP: Thickened endometrium/Pelvic pain for Sonohysto  HPI: Resolved pelvic pain after treatment of acute cystitis.  Seen 10/23/2021 with PMB, from bladder or vagina.  Had an acute Cystitis to Citrobacter Freundii treated with Cipro at that time.  No further bleeding after treatment, therefore probably hematuria associated with Cystitis.  Pelvic US on 01/16/22 with a sub-optimal visualization of the endometrial canal, endometrial thickness measured at 9.9 mm.   OB History  Gravida Para Term Preterm AB Living  3 2     1 2   SAB IAB Ectopic Multiple Live Births    1          # Outcome Date GA Lbr Len/2nd Weight Sex Delivery Anes PTL Lv  3 IAB           2 Para           1 Para             Past medical history,surgical history, problem list, medications, allergies, family history and social history were all reviewed and documented in the EPIC chart.   Directed ROS with pertinent positives and negatives documented in the history of present illness/assessment and plan.  Exam:  There were no vitals filed for this visit. General appearance:  Normal                                                                    Sono Infusion Hysterogram ( procedure note)   The initial transvaginal ultrasound demonstrated the following:  Endometrial thickness measured at 6.11 mm.  The speculum  was inserted and the cervix cleansed with Betadine solution after confirming that patient has no allergies.A small sonohysterography catheterwas utilized.  Insertion was facilitated with ring forceps, using a spear-like motion the catheter was inserted to the fundus of the uterus. The speculum is then removed carefully to avoid dislodging the catheter. The catheter was flushed with sterile saline delete prior to insertion to rid it of small amounts of air.the sterile saline solution was infused into the uterine cavity as a vaginal ultrasound probe was  then placed in the vagina for full visualization of the uterine cavity from a transvaginal approach. The following was noted:  Sonohysterogram performed with injection of saline in the intra uterine cavity.  No intracavitary mass identified.  Symmetrical endometrium measured at approximately 4.73 mm.  Successful sonohysterogram.  The catheter was then removed after retrieving some of the saline from the intrauterine cavity. An endometrial biopsy was done after obtaining an informed written consent.  Betadine prep.  Hurricaine spray.  Easy endometrial . Patient tolerated procedure well.      Assessment/Plan:  61 y.o. 64   1. Thickened endometrium Resolved pelvic pain after treatment of acute cystitis.  Seen 10/23/2021 with PMB, from bladder or vagina.  Had an acute Cystitis to Citrobacter Freundii treated with Cipro at that time.  No further bleeding after treatment, therefore probably hematuria associated with Cystitis.  Pelvic 12/23/2021 on 01/16/22 with a sub-optimal visualization of the endometrial canal, endometrial thickness measured at 9.9 mm.  Sonohysto today showing no intracavitary mass.  Symmetrical endometrium measured at approximately 4.73 mm.  Successful sonohysterogram.  Patient reassured.  EBx pending. Management per EBx results. - Surgical pathology( [place]/ [facility])  2. Pelvic pain in female  Spontaneous resolution of pelvic pain.  [name], 11:13 AM 02/13/2022

## 2022-02-14 LAB — SURGICAL PATHOLOGY

## 2022-02-20 ENCOUNTER — Encounter: Payer: Self-pay | Admitting: Obstetrics & Gynecology

## 2022-04-01 ENCOUNTER — Encounter: Payer: Self-pay | Admitting: Obstetrics & Gynecology

## 2022-04-01 ENCOUNTER — Ambulatory Visit: Payer: BC Managed Care – PPO | Admitting: Radiology

## 2022-04-01 VITALS — BP 144/84 | Temp 98.8°F

## 2022-04-01 DIAGNOSIS — R350 Frequency of micturition: Secondary | ICD-10-CM

## 2022-04-01 NOTE — Progress Notes (Signed)
      SUBJECTIVE: Lori Snyder is a 61 y.o. female who complains of  lower back and lower pelvic pain x's 1 week, took Tylenol and symptoms subsided and then returned last Thursday with increased urinary frequency last night.  Had positive Covid19 test 03/25/22 and again today.     Allergies  Allergen Reactions   Asa [Aspirin] Other (See Comments)    Stomach pain   Ciprofloxacin     Other reaction(s): severe headache   Melatonin     Other reaction(s): migraines    Current Outpatient Medications on File Prior to Visit  Medication Sig Dispense Refill   Ascorbic Acid (VITAMIN C PO) Take by mouth.     Coenzyme Q10 (CO Q 10 PO) Take by mouth.     Multiple Vitamin (MULTIVITAMIN PO) Take by mouth.     MYRBETRIQ 25 MG TB24 tablet Take 25 mg by mouth as needed.     VITAMIN D PO Take by mouth. (Patient not taking: Reported on 04/01/2022)     No current facility-administered medications on file prior to visit.    Past Medical History:  Diagnosis Date   Osteopenia 05/2017   T score -1.1 FRAX 2.8% / 0.2%     OBJECTIVE: Appears well, in no apparent distress.  Vital signs are normal. The abdomen is soft without tenderness, guarding, mass, rebound or organomegaly. No CVA tenderness or inguinal adenopathy noted. Uterus non tender, mobile, no adnexal masses on bimanual exam.  Urine dipstick shows positive for RBC's and positive for leukocytes.  Micro exam: 0-5 WBC's per HPF and 0-2 RBC's per HPF.   Blood pressure (!) 144/84, temperature 98.8 F (37.1 C), temperature source Oral.    Chaperone offered and declined for exam.  ASSESSMENT/PLAN: 1. Urinary frequency - restart Mybetriq -Tylenol prn back pain - Urinalysis,Complete w/RFL Culture    Will send urine culture and sensitivity.  Treatment per orders - also push fluids, avoid bladder irritants. Instructed she may use Pyridium OTC prn. Call or return to clinic prn if these symptoms worsen or fail to improve as anticipated. Pyelo  precautions reviewed with patient.

## 2022-04-01 NOTE — Telephone Encounter (Signed)
I spoke with patient by phone. Tested Covid pos last Tuesday and still pos today. No sx accept fatigue and chills last week but that is gone. No fever.  She tested because of the pain.  I consulted with Wende Crease, NP and she agreed OV with mask today. Patient informed to be here by 1:15pm to check in.

## 2022-04-03 ENCOUNTER — Telehealth: Payer: Self-pay

## 2022-04-03 NOTE — Telephone Encounter (Signed)
-----   Message from Kerry Dory, NP sent at 04/03/2022  8:59 AM EDT ----- Negative urine culture- no UTI.

## 2022-04-03 NOTE — Telephone Encounter (Signed)
Patient informed of neg ur cult/no UTI. She had questions.  Patient reports that she went to urinate every hour last night and so she hardly slept last night. The back and abd pain has subsided. Has been taking the Myrbetriq since visit as directed.    She asked why is she still having this frequency?  She asked about the trace of blood and bacteria in the urine on u/a and asked did that not mean anything?

## 2022-04-03 NOTE — Telephone Encounter (Signed)
Likely related to her OAB. We can offer a referral to urology.

## 2022-04-03 NOTE — Telephone Encounter (Signed)
Spoke with patient and informed her.  She wants to wait and see if the medication will help. She will call back if she desires referral to urology.

## 2022-04-15 DIAGNOSIS — Z1231 Encounter for screening mammogram for malignant neoplasm of breast: Secondary | ICD-10-CM | POA: Diagnosis not present

## 2022-04-17 LAB — URINALYSIS, COMPLETE W/RFL CULTURE
Bacteria, UA: NONE SEEN /HPF
Bilirubin Urine: NEGATIVE
Casts: NONE SEEN /LPF
Crystals: NONE SEEN /HPF
Glucose, UA: NEGATIVE
Hyaline Cast: NONE SEEN /LPF
Ketones, ur: NEGATIVE
Nitrites, Initial: NEGATIVE
Protein, ur: NEGATIVE
Specific Gravity, Urine: 1.004 (ref 1.001–1.035)
Yeast: NONE SEEN /HPF
pH: 6 (ref 5.0–8.0)

## 2022-04-17 LAB — URINE CULTURE
MICRO NUMBER:: 14031075
Result:: NO GROWTH
SPECIMEN QUALITY:: ADEQUATE

## 2022-04-17 LAB — CULTURE INDICATED

## 2022-05-02 ENCOUNTER — Encounter: Payer: Self-pay | Admitting: Obstetrics & Gynecology

## 2022-05-08 ENCOUNTER — Ambulatory Visit: Payer: BC Managed Care – PPO | Admitting: Obstetrics & Gynecology

## 2022-05-28 ENCOUNTER — Ambulatory Visit: Payer: BC Managed Care – PPO | Admitting: Obstetrics & Gynecology

## 2022-06-26 ENCOUNTER — Encounter: Payer: Self-pay | Admitting: Obstetrics & Gynecology

## 2022-06-26 ENCOUNTER — Ambulatory Visit (INDEPENDENT_AMBULATORY_CARE_PROVIDER_SITE_OTHER): Payer: BC Managed Care – PPO | Admitting: Obstetrics & Gynecology

## 2022-06-26 ENCOUNTER — Other Ambulatory Visit (HOSPITAL_COMMUNITY)
Admission: RE | Admit: 2022-06-26 | Discharge: 2022-06-26 | Disposition: A | Discharge status: Home / Self Care | Payer: BC Managed Care – PPO | Source: Ambulatory Visit | Attending: Obstetrics & Gynecology | Admitting: Obstetrics & Gynecology

## 2022-06-26 VITALS — BP 120/70 | HR 98 | Resp 16 | Ht 58.5 in | Wt 141.0 lb

## 2022-06-26 DIAGNOSIS — Z780 Asymptomatic menopausal state: Secondary | ICD-10-CM | POA: Diagnosis not present

## 2022-06-26 DIAGNOSIS — N870 Mild cervical dysplasia: Secondary | ICD-10-CM | POA: Insufficient documentation

## 2022-06-26 DIAGNOSIS — Z01419 Encounter for gynecological examination (general) (routine) without abnormal findings: Secondary | ICD-10-CM | POA: Insufficient documentation

## 2022-06-26 DIAGNOSIS — R350 Frequency of micturition: Secondary | ICD-10-CM

## 2022-06-26 DIAGNOSIS — M8589 Other specified disorders of bone density and structure, multiple sites: Secondary | ICD-10-CM | POA: Diagnosis not present

## 2022-06-26 LAB — URINALYSIS, COMPLETE W/RFL CULTURE
Bacteria, UA: NONE SEEN /HPF
Bilirubin Urine: NEGATIVE
Casts: NONE SEEN /LPF
Crystals: NONE SEEN /HPF
Glucose, UA: NEGATIVE
Hyaline Cast: NONE SEEN /LPF
Ketones, ur: NEGATIVE
Leukocyte Esterase: NEGATIVE
Nitrites, Initial: NEGATIVE
Protein, ur: NEGATIVE
RBC / HPF: NONE SEEN /HPF (ref 0–2)
Specific Gravity, Urine: 1.015 (ref 1.001–1.035)
WBC, UA: NONE SEEN /HPF (ref 0–5)
Yeast: NONE SEEN /HPF
pH: 7 (ref 5.0–8.0)

## 2022-06-26 LAB — NO CULTURE INDICATED

## 2022-06-26 NOTE — Progress Notes (Addendum)
Lori Snyder 1960/09/02 [id number]   History:    62 y.o. G3P2A1L2  Widowed   RP:  Established patient presenting for annual gyn exam    HPI: Postmenopause, well on no HRT.  No PMB.  No pelvic pain.  Not currently sexually active.  Pap Neg/HPV HR Neg in 04/2021.  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 Pap reflex today. Urine and bowel movements normal.  Status post removal of bilateral breast implants 09/2020. Screening mammo Neg 03/2022 at [place].  Body mass index 28.97 with a very good muscle mass. 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  Colono 2021.  BD very mild Osteopenia 05/2019, will repeat here now. Patient declined the Flu vaccine.  Past medical history,surgical history, family history and social history were all reviewed and documented in the EPIC chart.  Gynecologic History No LMP recorded. Patient is postmenopausal.  Obstetric History OB History  Gravida Para Term Preterm AB Living  _0 SAB IAB Ectopic Multiple Live Births    1     2    # Outcome Date GA Lbr Len/2nd Weight Sex Delivery Anes PTL Lv  3 IAB           2 Term           1 Term              ROS: A ROS was performed and pertinent positives and negatives are included in the history. GENERAL: No fevers or chills. HEENT: No change in vision, no earache, sore throat or sinus congestion. NECK: No pain or stiffness. CARDIOVASCULAR: No chest pain or pressure. No palpitations. PULMONARY: No shortness of breath, cough or wheeze. GASTROINTESTINAL: No abdominal pain, nausea, vomiting or diarrhea, melena or bright red blood per rectum. GENITOURINARY: No urinary frequency, urgency, hesitancy or dysuria. MUSCULOSKELETAL: No joint or muscle pain, no back pain, no recent trauma. DERMATOLOGIC: No rash, no itching, no lesions. ENDOCRINE: No polyuria, polydipsia, no heat or cold intolerance. No recent change in weight. HEMATOLOGICAL: No anemia or easy bruising or bleeding. NEUROLOGIC: No headache, seizures, numbness,  tingling or weakness. PSYCHIATRIC: No depression, no loss of interest in normal activity or change in sleep pattern.     Exam:   BP 120/70   Pulse 98   Resp 16   Ht 4' 10.5" (1.486 m)   Wt 141 lb (64 kg)   BMI 28.97 kg/m   Body mass index is 28.97 kg/m.  General appearance : Well developed well nourished female. No acute distress HEENT: Eyes: no retinal hemorrhage or exudates,  Neck supple, trachea midline, no carotid bruits, no thyroidmegaly Lungs: Clear to auscultation, no rhonchi or wheezes, or rib retractions  Heart: Regular rate and rhythm, no murmurs or gallops Breast:Examined in sitting and supine position were symmetrical in appearance, no palpable masses or tenderness,  no skin retraction, no nipple inversion, no nipple discharge, no skin discoloration, no axillary or supraclavicular lymphadenopathy Abdomen: no palpable masses or tenderness, no rebound or guarding Extremities: no edema or skin discoloration or tenderness  Pelvic: Vulva: Normal             Vagina: No gross lesions or discharge  Cervix: No gross lesions or discharge.  Pap reflex done.  Uterus  AV, normal size, shape and consistency, non-tender and mobile  Adnexa  Without masses or tenderness  Anus: Normal   Assessment/Plan:  62 y.o. female for annual exam   1.  Encounter for routine gynecological examination with Papanicolaou smear of cervix Postmenopause, well on no HRT.  No PMB.  No pelvic pain.  Not currently sexually active.  Pap Neg/HPV HR Neg in 04/2021.  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 Pap reflex today. Urine and bowel movements normal.  Status post removal of bilateral breast implants 09/2020. Screening mammo Neg 03/2022 at [place].  Body mass index 28.97 with a very good muscle mass. 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  Colono 2021.  BD very mild Osteopenia 05/2019, will repeat here now. Patient declined the Flu vaccine. - Cytology - PAP( [place]) - CBC; Future - Comp Met (CMET);  Future - Lipid Profile; Future - TSH; Future - Vitamin D (25 hydroxy); Future  2. Dysplasia of cervix, low grade (CIN 1) - Cytology - PAP( [place])  3. Postmenopause Postmenopause, well on no HRT.  No PMB.  No pelvic pain.  Not currently sexually active.  4. Osteopenia of multiple sites BD very mild Osteopenia 05/2019, will repeat here now. - DG Bone Density; Future  5. Urinary frequency U/A Negative - Urinalysis,Complete w/RFL Culture  Other orders - losartan (COZAAR) 50 MG tablet; Take 50 mg by mouth.   Princess Bruins MD, 9:58 AM

## 2022-06-27 LAB — CYTOLOGY - PAP: Diagnosis: NEGATIVE

## 2022-07-29 ENCOUNTER — Other Ambulatory Visit: Payer: BC Managed Care – PPO

## 2022-07-29 DIAGNOSIS — Z01419 Encounter for gynecological examination (general) (routine) without abnormal findings: Secondary | ICD-10-CM | POA: Diagnosis not present

## 2022-07-30 LAB — COMPREHENSIVE METABOLIC PANEL
AG Ratio: 1.4 (calc) (ref 1.0–2.5)
ALT: 15 U/L (ref 6–29)
AST: 26 U/L (ref 10–35)
Albumin: 4.2 g/dL (ref 3.6–5.1)
Alkaline phosphatase (APISO): 81 U/L (ref 37–153)
BUN: 12 mg/dL (ref 7–25)
CO2: 30 mmol/L (ref 20–32)
Calcium: 9.6 mg/dL (ref 8.6–10.4)
Chloride: 106 mmol/L (ref 98–110)
Creat: 0.78 mg/dL (ref 0.50–1.05)
Globulin: 3 g/dL (calc) (ref 1.9–3.7)
Glucose, Bld: 81 mg/dL (ref 65–99)
Potassium: 4.8 mmol/L (ref 3.5–5.3)
Sodium: 143 mmol/L (ref 135–146)
Total Bilirubin: 0.4 mg/dL (ref 0.2–1.2)
Total Protein: 7.2 g/dL (ref 6.1–8.1)

## 2022-07-30 LAB — TSH: TSH: 1.1 mIU/L (ref 0.40–4.50)

## 2022-07-30 LAB — LIPID PANEL
Cholesterol: 174 mg/dL (ref <200)
HDL: 83 mg/dL (ref >=50)
LDL Cholesterol (Calc): 77 mg/dL (calc)
Non-HDL Cholesterol (Calc): 91 mg/dL (calc) (ref <130)
Total CHOL/HDL Ratio: 2.1 (calc) (ref <5.0)
Triglycerides: 59 mg/dL (ref <150)

## 2022-07-30 LAB — CBC
HCT: 41.5 % (ref 35.0–45.0)
Hemoglobin: 13.7 g/dL (ref 11.7–15.5)
MCH: 29 pg (ref 27.0–33.0)
MCHC: 33 g/dL (ref 32.0–36.0)
MCV: 87.9 fL (ref 80.0–100.0)
MPV: 10.6 fL (ref 7.5–12.5)
Platelets: 272 10*3/uL (ref 140–400)
RBC: 4.72 10*6/uL (ref 3.80–5.10)
RDW: 12.5 % (ref 11.0–15.0)
WBC: 6.7 10*3/uL (ref 3.8–10.8)

## 2022-07-30 LAB — VITAMIN D 25 HYDROXY (VIT D DEFICIENCY, FRACTURES): Vit D, 25-Hydroxy: 65 ng/mL (ref 30–100)

## 2022-08-08 ENCOUNTER — Encounter: Payer: Self-pay | Admitting: Obstetrics & Gynecology

## 2022-08-08 NOTE — Telephone Encounter (Signed)
Spoke with pt over phone. Understands results

## 2022-08-19 ENCOUNTER — Ambulatory Visit (INDEPENDENT_AMBULATORY_CARE_PROVIDER_SITE_OTHER): Payer: BC Managed Care – PPO

## 2022-08-19 ENCOUNTER — Other Ambulatory Visit: Payer: Self-pay | Admitting: Obstetrics & Gynecology

## 2022-08-19 DIAGNOSIS — N870 Mild cervical dysplasia: Secondary | ICD-10-CM

## 2022-08-19 DIAGNOSIS — Z780 Asymptomatic menopausal state: Secondary | ICD-10-CM | POA: Diagnosis not present

## 2022-08-19 DIAGNOSIS — M8589 Other specified disorders of bone density and structure, multiple sites: Secondary | ICD-10-CM

## 2022-08-19 DIAGNOSIS — R350 Frequency of micturition: Secondary | ICD-10-CM

## 2022-08-19 DIAGNOSIS — Z13820 Encounter for screening for osteoporosis: Secondary | ICD-10-CM

## 2022-08-19 DIAGNOSIS — Z01419 Encounter for gynecological examination (general) (routine) without abnormal findings: Secondary | ICD-10-CM

## 2022-08-29 ENCOUNTER — Encounter: Payer: Self-pay | Admitting: Obstetrics & Gynecology

## 2022-09-11 ENCOUNTER — Encounter: Payer: Self-pay | Admitting: Obstetrics & Gynecology

## 2022-10-14 DIAGNOSIS — R030 Elevated blood-pressure reading, without diagnosis of hypertension: Secondary | ICD-10-CM | POA: Diagnosis not present

## 2022-10-14 DIAGNOSIS — F5101 Primary insomnia: Secondary | ICD-10-CM | POA: Diagnosis not present

## 2022-10-14 DIAGNOSIS — N3281 Overactive bladder: Secondary | ICD-10-CM | POA: Diagnosis not present

## 2022-10-14 DIAGNOSIS — M7712 Lateral epicondylitis, left elbow: Secondary | ICD-10-CM | POA: Diagnosis not present

## 2022-10-14 DIAGNOSIS — Z23 Encounter for immunization: Secondary | ICD-10-CM | POA: Diagnosis not present

## 2022-10-14 DIAGNOSIS — Z0000 Encounter for general adult medical examination without abnormal findings: Secondary | ICD-10-CM | POA: Diagnosis not present

## 2023-04-21 DIAGNOSIS — Z1231 Encounter for screening mammogram for malignant neoplasm of breast: Secondary | ICD-10-CM | POA: Diagnosis not present

## 2023-09-09 ENCOUNTER — Other Ambulatory Visit (HOSPITAL_COMMUNITY)
Admission: RE | Admit: 2023-09-09 | Discharge: 2023-09-09 | Disposition: A | Discharge status: Home / Self Care | Source: Ambulatory Visit | Attending: Obstetrics and Gynecology | Admitting: Obstetrics and Gynecology

## 2023-09-09 ENCOUNTER — Ambulatory Visit (INDEPENDENT_AMBULATORY_CARE_PROVIDER_SITE_OTHER): Payer: BC Managed Care – PPO | Admitting: Obstetrics and Gynecology

## 2023-09-09 ENCOUNTER — Encounter: Payer: Self-pay | Admitting: Obstetrics and Gynecology

## 2023-09-09 VITALS — BP 110/64 | HR 63 | Ht 58.5 in | Wt 144.0 lb

## 2023-09-09 DIAGNOSIS — Z01419 Encounter for gynecological examination (general) (routine) without abnormal findings: Secondary | ICD-10-CM | POA: Diagnosis not present

## 2023-09-09 DIAGNOSIS — Z1331 Encounter for screening for depression: Secondary | ICD-10-CM | POA: Diagnosis not present

## 2023-09-09 DIAGNOSIS — R350 Frequency of micturition: Secondary | ICD-10-CM

## 2023-09-09 DIAGNOSIS — Z1231 Encounter for screening mammogram for malignant neoplasm of breast: Secondary | ICD-10-CM

## 2023-09-09 MED ORDER — INTRAROSA 6.5 MG VA INST: 1.0000 | VAGINAL_INSERT | Freq: Every evening | VAGINAL | 12 refills | Status: AC | PRN

## 2023-09-09 NOTE — Progress Notes (Signed)
 63 y.o. y.o. female here for annual exam. No LMP recorded. Patient is postmenopausal.    [id number]  Widowed   RP:  Established patient presenting for annual gyn exam    HPI: Postmenopause, well on no HRT.  No PMB.  No pelvic pain.  Not currently sexually active.  Pap Neg/HPV HR Neg in 04/2021 08/1922 repeat done. Desires continue testing after 63.  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 Pap reflex today. Urine and bowel movements normal.  Status post removal of bilateral breast implants 09/2020. Screening mammo Neg 03/2022 at [place].  Body mass index 28.97 with a very good muscle mass. Patient is a Psychologist, educational.  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  Colono 2021.Desires to do q 5 years.  BD very mild Osteopenia 05/2019, 2024 AP spine -2.0,repeat q 2 years. Patient declined the Flu vaccine. Labs here to return for them. Urinary frequency: referral to urogyn. Reports vaginal dryness: to begin intrarosa. Not sexually active  Body mass index is 29.58 kg/m.     09/09/2023    8:13 AM  Depression screen PHQ 2/9  Decreased Interest 0  Down, Depressed, Hopeless 0  PHQ - 2 Score 0    Blood pressure 110/64, pulse 63, height 4' 10.5" (1.486 m), weight 144 lb (65.3 kg), SpO2 99%.     Component Value Date/Time   DIAGPAP  06/26/2022 1020    - Negative for intraepithelial lesion or malignancy (NILM)   DIAGPAP  05/02/2021 0955    - Negative for intraepithelial lesion or malignancy (NILM)   HPVHIGH Negative 05/02/2021 0955   ADEQPAP  06/26/2022 1020    Satisfactory for evaluation. The presence or absence of an   ADEQPAP  06/26/2022 1020    endocervical/transformation zone component cannot be determined because   ADEQPAP of atrophy. 06/26/2022 1020    GYN HISTORY:    Component Value Date/Time   DIAGPAP  06/26/2022 1020    - Negative for intraepithelial lesion or malignancy (NILM)   DIAGPAP  05/02/2021 0955    - Negative for intraepithelial lesion or malignancy (NILM)   HPVHIGH Negative 05/02/2021 0955    ADEQPAP  06/26/2022 1020    Satisfactory for evaluation. The presence or absence of an   ADEQPAP  06/26/2022 1020    endocervical/transformation zone component cannot be determined because   ADEQPAP of atrophy. 06/26/2022 1020    OB History  Gravida Para Term Preterm AB Living  3 2 2  1 2   SAB IAB Ectopic Multiple Live Births   1   2    # Outcome Date GA Lbr Len/2nd Weight Sex Type Anes PTL Lv  3 IAB           2 Term           1 Term             Past Medical History:  Diagnosis Date   Hypertension    Osteopenia 05/2017   T score -1.1 FRAX 2.8% / 0.2%    Past Surgical History:  Procedure Laterality Date   APPENDECTOMY     AUGMENTATION MAMMAPLASTY     BREAST IMPLANT REMOVAL     CESAREAN SECTION     PLACEMENT OF BREAST IMPLANTS     06-22-23    Current Outpatient Medications on File Prior to Visit  Medication Sig Dispense Refill   Ascorbic Acid (VITAMIN C PO) Take by mouth.     Coenzyme Q10 (CO Q 10 PO) Take by mouth.  MYRBETRIQ 25 MG TB24 tablet Take 25 mg by mouth as needed.     VITAMIN D PO Take by mouth.     No current facility-administered medications on file prior to visit.    Social History   Socioeconomic History   Marital status: Widowed    Spouse name: Not on file   Number of children: Not on file   Years of education: Not on file   Highest education level: Not on file  Occupational History   Not on file  Tobacco Use   Smoking status: Never   Smokeless tobacco: Never  Vaping Use   Vaping status: Never Used  Substance and Sexual Activity   Alcohol use: No   Drug use: No   Sexual activity: Not Currently    Partners: Male    Birth control/protection: Post-menopausal, Abstinence    Comment: 1st intercourse 1 yo-5 partners  Other Topics Concern   Not on file  Social History Narrative   Not on file   Social Drivers of Health   Financial Resource Strain: Not on file  Food Insecurity: Not on file  Transportation Needs: Not on file   Physical Activity: Not on file  Stress: Not on file  Social Connections: Not on file  Intimate Partner Violence: Not on file    Family History  Problem Relation Age of Onset   Cancer Father        Prostate   Heart failure Paternal Grandmother      Allergies  Allergen Reactions   Asa [Aspirin] Other (See Comments)    Stomach pain   Ciprofloxacin     Other reaction(s): severe headache   Melatonin     Other reaction(s): migraines      Patient's last menstrual period was No LMP recorded. Patient is postmenopausal..            Review of Systems Alls systems reviewed and are negative.     Physical Exam Constitutional:      Appearance: Normal appearance.  Genitourinary:     Vulva and urethral meatus normal.     No lesions in the vagina.     Right Labia: No rash, lesions or skin changes.    Left Labia: No lesions, skin changes or rash.    No vaginal discharge or tenderness.     No vaginal prolapse present.    Moderate vaginal atrophy present.     Right Adnexa: not tender, not palpable and no mass present.    Left Adnexa: not tender, not palpable and no mass present.    No cervical motion tenderness or discharge.     Uterus is not enlarged, tender or irregular.  Breasts:    Right: Normal.     Left: Normal.  HENT:     Head: Normocephalic.  Neck:     Thyroid: No thyroid mass, thyromegaly or thyroid tenderness.  Cardiovascular:     Rate and Rhythm: Normal rate and regular rhythm.     Heart sounds: Normal heart sounds, S1 normal and S2 normal.  Pulmonary:     Effort: Pulmonary effort is normal.     Breath sounds: Normal breath sounds and air entry.  Abdominal:     General: There is no distension.     Palpations: Abdomen is soft. There is no mass.     Tenderness: There is no abdominal tenderness. There is no guarding or rebound.  Musculoskeletal:        General: Normal range of motion.  Cervical back: Full passive range of motion without pain, normal range  of motion and neck supple. No tenderness.     Right lower leg: No edema.     Left lower leg: No edema.  Neurological:     Mental Status: She is alert.  Skin:    General: Skin is warm.  Psychiatric:        Mood and Affect: Mood normal.        Behavior: Behavior normal.        Thought Content: Thought content normal.  Vitals and nursing note reviewed. Exam conducted with a chaperone present.       A:         Well Woman GYN exam                             P:        Pap smear collected today Desires continued pap smear screening after 63 Encouraged annual mammogram screening Colon cancer screening up-to-date DXA up-to-date Labs and immunizations ordered today  She will return for them Encouraged healthy lifestyle practices Encouraged Vit D and Calcium   No follow-ups on file.  Earley Favor

## 2023-09-09 NOTE — Patient Instructions (Signed)
 Just blood was seen on the urine sample today. This could be due from the vaginal dryness Dr. Karma Greaser

## 2023-09-10 ENCOUNTER — Other Ambulatory Visit

## 2023-09-10 ENCOUNTER — Encounter: Payer: Self-pay | Admitting: Obstetrics and Gynecology

## 2023-09-10 DIAGNOSIS — Z01419 Encounter for gynecological examination (general) (routine) without abnormal findings: Secondary | ICD-10-CM

## 2023-09-10 LAB — CYTOLOGY - PAP: Diagnosis: NEGATIVE

## 2023-09-11 ENCOUNTER — Encounter: Payer: Self-pay | Admitting: Obstetrics and Gynecology

## 2023-09-11 LAB — CBC
HCT: 38.3 % (ref 35.0–45.0)
Hemoglobin: 12.3 g/dL (ref 11.7–15.5)
MCH: 28.8 pg (ref 27.0–33.0)
MCHC: 32.1 g/dL (ref 32.0–36.0)
MCV: 89.7 fL (ref 80.0–100.0)
MPV: 10.5 fL (ref 7.5–12.5)
Platelets: 266 10*3/uL (ref 140–400)
RBC: 4.27 10*6/uL (ref 3.80–5.10)
RDW: 13.6 % (ref 11.0–15.0)
WBC: 4.5 10*3/uL (ref 3.8–10.8)

## 2023-09-11 LAB — COMPREHENSIVE METABOLIC PANEL
AG Ratio: 1.4 (calc) (ref 1.0–2.5)
ALT: 14 U/L (ref 6–29)
AST: 25 U/L (ref 10–35)
Albumin: 3.8 g/dL (ref 3.6–5.1)
Alkaline phosphatase (APISO): 74 U/L (ref 37–153)
BUN: 14 mg/dL (ref 7–25)
CO2: 26 mmol/L (ref 20–32)
Calcium: 8.8 mg/dL (ref 8.6–10.4)
Chloride: 106 mmol/L (ref 98–110)
Creat: 0.8 mg/dL (ref 0.50–1.05)
Globulin: 2.8 g/dL (ref 1.9–3.7)
Glucose, Bld: 81 mg/dL (ref 65–99)
Potassium: 4 mmol/L (ref 3.5–5.3)
Sodium: 140 mmol/L (ref 135–146)
Total Bilirubin: 0.6 mg/dL (ref 0.2–1.2)
Total Protein: 6.6 g/dL (ref 6.1–8.1)
eGFR: 82 mL/min/{1.73_m2} (ref >=60)

## 2023-09-11 LAB — URINALYSIS, COMPLETE W/RFL CULTURE
Bacteria, UA: NONE SEEN /HPF
Bilirubin Urine: NEGATIVE
Glucose, UA: NEGATIVE
Hyaline Cast: NONE SEEN /LPF
Ketones, ur: NEGATIVE
Leukocyte Esterase: NEGATIVE
Nitrites, Initial: NEGATIVE
Protein, ur: NEGATIVE
Specific Gravity, Urine: 1.025 (ref 1.001–1.035)
pH: 6 (ref 5.0–8.0)

## 2023-09-11 LAB — CULTURE INDICATED

## 2023-09-11 LAB — LIPID PANEL
Cholesterol: 170 mg/dL (ref <200)
HDL: 82 mg/dL (ref >=50)
LDL Cholesterol (Calc): 77 mg/dL
Non-HDL Cholesterol (Calc): 88 mg/dL (ref <130)
Total CHOL/HDL Ratio: 2.1 (calc) (ref <5.0)
Triglycerides: 37 mg/dL (ref <150)

## 2023-09-11 LAB — HEMOGLOBIN A1C
Hgb A1c MFr Bld: 5.2 %{Hb} (ref <5.7)
Mean Plasma Glucose: 103 mg/dL
eAG (mmol/L): 5.7 mmol/L

## 2023-09-11 LAB — URINE CULTURE
MICRO NUMBER:: 16220713
Result:: NO GROWTH
SPECIMEN QUALITY:: ADEQUATE

## 2023-09-11 LAB — TSH: TSH: 0.89 m[IU]/L (ref 0.40–4.50)

## 2023-09-11 LAB — VITAMIN D 25 HYDROXY (VIT D DEFICIENCY, FRACTURES): Vit D, 25-Hydroxy: 53 ng/mL (ref 30–100)

## 2023-09-15 ENCOUNTER — Telehealth: Payer: Self-pay

## 2023-09-15 NOTE — Telephone Encounter (Signed)
 Spoke w/ the pt and she is currently unavailable to discuss due to being at work. Would like to be called back to discuss on Thurs 09/17/2023 at 9.   Advised the pt during the call that all results were WNL. However, I would be happy to provide her a thorough explanation of each testing and results. She said that would be nice but will likely still want to talk by phone.   I & the pt voiced understanding/agreeable.

## 2023-09-15 NOTE — Telephone Encounter (Signed)
-----   Message from [place] S sent at 09/15/2023  8:56 AM EDT ----- Regarding: ? Comments: Hello, I would like to discuss my lab results over the phone or in person.- per patient  Can you call her to discuss or do you want office visit with provider?

## 2023-09-18 NOTE — Telephone Encounter (Signed)
 Spoke w/ the pt and she said that she would have to r/s our talk for another day next week due to an unexpected meeting today but will let us know her available times via mychart once she received her requested copy of labs in the mail.   Will mail out today. Address confirmed.

## 2023-09-29 NOTE — Telephone Encounter (Signed)
 Closing encounter for now. Pt to reach out when ready to discuss results further.

## 2023-11-10 DIAGNOSIS — R350 Frequency of micturition: Secondary | ICD-10-CM | POA: Diagnosis not present

## 2023-11-10 DIAGNOSIS — N3281 Overactive bladder: Secondary | ICD-10-CM | POA: Diagnosis not present

## 2024-02-03 DIAGNOSIS — R350 Frequency of micturition: Secondary | ICD-10-CM | POA: Diagnosis not present

## 2024-02-03 DIAGNOSIS — R351 Nocturia: Secondary | ICD-10-CM | POA: Diagnosis not present

## 2024-04-19 DIAGNOSIS — N3281 Overactive bladder: Secondary | ICD-10-CM | POA: Diagnosis not present

## 2024-04-19 DIAGNOSIS — Z0000 Encounter for general adult medical examination without abnormal findings: Secondary | ICD-10-CM | POA: Diagnosis not present

## 2024-04-19 DIAGNOSIS — M8580 Other specified disorders of bone density and structure, unspecified site: Secondary | ICD-10-CM | POA: Diagnosis not present

## 2024-05-16 DIAGNOSIS — R928 Other abnormal and inconclusive findings on diagnostic imaging of breast: Secondary | ICD-10-CM | POA: Diagnosis not present
